# Patient Record
Sex: Female | Born: 1950 | Race: Black or African American | Hispanic: No | Marital: Married | Smoking: Never smoker
Health system: Southern US, Community
[De-identification: ages and names within clinical notes are randomized; demographics above are authoritative.]

## PROBLEM LIST (undated history)

## (undated) DIAGNOSIS — E039 Hypothyroidism, unspecified: Secondary | ICD-10-CM

## (undated) DIAGNOSIS — E7800 Pure hypercholesterolemia, unspecified: Secondary | ICD-10-CM

## (undated) DIAGNOSIS — J45909 Unspecified asthma, uncomplicated: Secondary | ICD-10-CM

## (undated) DIAGNOSIS — K5792 Diverticulitis of intestine, part unspecified, without perforation or abscess without bleeding: Secondary | ICD-10-CM

## (undated) DIAGNOSIS — Z853 Personal history of malignant neoplasm of breast: Secondary | ICD-10-CM

## (undated) DIAGNOSIS — J309 Allergic rhinitis, unspecified: Secondary | ICD-10-CM

## (undated) DIAGNOSIS — E119 Type 2 diabetes mellitus without complications: Secondary | ICD-10-CM

## (undated) DIAGNOSIS — Z860100 Personal history of colon polyps, unspecified: Secondary | ICD-10-CM

## (undated) DIAGNOSIS — N952 Postmenopausal atrophic vaginitis: Secondary | ICD-10-CM

## (undated) DIAGNOSIS — F430 Acute stress reaction: Secondary | ICD-10-CM

## (undated) DIAGNOSIS — E559 Vitamin D deficiency, unspecified: Secondary | ICD-10-CM

## (undated) DIAGNOSIS — E041 Nontoxic single thyroid nodule: Secondary | ICD-10-CM

## (undated) DIAGNOSIS — M545 Low back pain, unspecified: Secondary | ICD-10-CM

## (undated) DIAGNOSIS — D869 Sarcoidosis, unspecified: Secondary | ICD-10-CM

## (undated) DIAGNOSIS — K219 Gastro-esophageal reflux disease without esophagitis: Secondary | ICD-10-CM

## (undated) DIAGNOSIS — F419 Anxiety disorder, unspecified: Secondary | ICD-10-CM

## (undated) DIAGNOSIS — G479 Sleep disorder, unspecified: Secondary | ICD-10-CM

## (undated) DIAGNOSIS — I10 Essential (primary) hypertension: Secondary | ICD-10-CM

## (undated) DIAGNOSIS — M109 Gout, unspecified: Secondary | ICD-10-CM

## (undated) DIAGNOSIS — N189 Chronic kidney disease, unspecified: Secondary | ICD-10-CM

## (undated) HISTORY — DX: Personal history of colon polyps, unspecified: Z86.0100

## (undated) HISTORY — DX: Vitamin D deficiency, unspecified: E55.9

## (undated) HISTORY — PX: MASTECTOMY: SHX3

## (undated) HISTORY — PX: TONSILLECTOMY: SUR1361

## (undated) HISTORY — DX: Personal history of colonic polyps: Z86.010

## (undated) HISTORY — DX: Allergic rhinitis, unspecified: J30.9

## (undated) HISTORY — DX: Type 2 diabetes mellitus without complications: E11.9

## (undated) HISTORY — DX: Sleep disorder, unspecified: G47.9

## (undated) HISTORY — PX: TOTAL ABDOMINAL HYSTERECTOMY: SHX209

## (undated) HISTORY — PX: CHOLECYSTECTOMY: SHX55

## (undated) HISTORY — PX: BREAST LUMPECTOMY: SHX2

## (undated) HISTORY — DX: Gout, unspecified: M10.9

## (undated) HISTORY — DX: Hypothyroidism, unspecified: E03.9

## (undated) HISTORY — PX: LAPAROSCOPIC INGUINAL HERNIA WITH UMBILICAL HERNIA: SHX5658

## (undated) HISTORY — DX: Personal history of malignant neoplasm of breast: Z85.3

## (undated) HISTORY — DX: Anxiety disorder, unspecified: F41.9

## (undated) HISTORY — PX: HEMORRHOID SURGERY: SHX153

## (undated) HISTORY — DX: Pure hypercholesterolemia, unspecified: E78.00

## (undated) HISTORY — PX: COLONOSCOPY: SHX174

## (undated) HISTORY — DX: Essential (primary) hypertension: I10

## (undated) HISTORY — DX: Gastro-esophageal reflux disease without esophagitis: K21.9

## (undated) HISTORY — DX: Unspecified asthma, uncomplicated: J45.909

## (undated) HISTORY — DX: Postmenopausal atrophic vaginitis: N95.2

## (undated) HISTORY — DX: Sarcoidosis, unspecified: D86.9

## (undated) HISTORY — DX: Low back pain, unspecified: M54.50

## (undated) HISTORY — DX: Diverticulitis of intestine, part unspecified, without perforation or abscess without bleeding: K57.92

## (undated) HISTORY — PX: OTHER SURGICAL HISTORY: SHX169

## (undated) HISTORY — DX: Low back pain: M54.5

## (undated) HISTORY — DX: Nontoxic single thyroid nodule: E04.1

## (undated) HISTORY — PX: TUBAL LIGATION: SHX77

## (undated) HISTORY — DX: Acute stress reaction: F43.0

---

## 2000-05-01 ENCOUNTER — Inpatient Hospital Stay (HOSPITAL_COMMUNITY): Admission: AD | Admit: 2000-05-01 | Discharge: 2000-05-02 | Payer: Self-pay | Admitting: Obstetrics

## 2000-05-02 ENCOUNTER — Encounter: Payer: Self-pay | Admitting: *Deleted

## 2000-08-08 ENCOUNTER — Other Ambulatory Visit: Admission: RE | Admit: 2000-08-08 | Discharge: 2000-08-08 | Payer: Self-pay | Admitting: Obstetrics and Gynecology

## 2000-08-08 ENCOUNTER — Encounter: Payer: Self-pay | Admitting: Obstetrics and Gynecology

## 2000-08-08 ENCOUNTER — Encounter: Admission: RE | Admit: 2000-08-08 | Discharge: 2000-08-08 | Payer: Self-pay | Admitting: Obstetrics and Gynecology

## 2000-08-08 ENCOUNTER — Encounter (INDEPENDENT_AMBULATORY_CARE_PROVIDER_SITE_OTHER): Payer: Self-pay | Admitting: *Deleted

## 2002-07-21 ENCOUNTER — Other Ambulatory Visit: Admission: RE | Admit: 2002-07-21 | Discharge: 2002-07-21 | Payer: Self-pay | Admitting: Obstetrics and Gynecology

## 2002-07-26 ENCOUNTER — Encounter: Payer: Self-pay | Admitting: Obstetrics and Gynecology

## 2002-07-26 ENCOUNTER — Ambulatory Visit (HOSPITAL_COMMUNITY): Admission: RE | Admit: 2002-07-26 | Discharge: 2002-07-26 | Payer: Self-pay | Admitting: Obstetrics and Gynecology

## 2002-07-27 ENCOUNTER — Encounter: Payer: Self-pay | Admitting: Obstetrics and Gynecology

## 2002-07-27 ENCOUNTER — Encounter: Admission: RE | Admit: 2002-07-27 | Discharge: 2002-07-27 | Payer: Self-pay | Admitting: Obstetrics and Gynecology

## 2002-11-03 ENCOUNTER — Ambulatory Visit (HOSPITAL_COMMUNITY): Admission: RE | Admit: 2002-11-03 | Discharge: 2002-11-03 | Payer: Self-pay | Admitting: Obstetrics and Gynecology

## 2002-11-03 ENCOUNTER — Encounter: Payer: Self-pay | Admitting: Obstetrics and Gynecology

## 2002-11-09 ENCOUNTER — Encounter: Admission: RE | Admit: 2002-11-09 | Discharge: 2002-11-22 | Payer: Self-pay | Admitting: Family Medicine

## 2003-08-23 ENCOUNTER — Encounter: Admission: RE | Admit: 2003-08-23 | Discharge: 2003-08-23 | Payer: Self-pay | Admitting: Obstetrics and Gynecology

## 2003-09-09 ENCOUNTER — Ambulatory Visit (HOSPITAL_COMMUNITY): Admission: RE | Admit: 2003-09-09 | Discharge: 2003-09-09 | Payer: Self-pay | Admitting: Obstetrics and Gynecology

## 2003-09-16 ENCOUNTER — Other Ambulatory Visit: Admission: RE | Admit: 2003-09-16 | Discharge: 2003-09-16 | Payer: Self-pay | Admitting: Obstetrics and Gynecology

## 2004-09-26 ENCOUNTER — Other Ambulatory Visit: Admission: RE | Admit: 2004-09-26 | Discharge: 2004-09-26 | Payer: Self-pay | Admitting: Obstetrics and Gynecology

## 2004-10-09 ENCOUNTER — Ambulatory Visit (HOSPITAL_COMMUNITY): Admission: RE | Admit: 2004-10-09 | Discharge: 2004-10-09 | Payer: Self-pay | Admitting: Obstetrics and Gynecology

## 2004-10-09 ENCOUNTER — Encounter: Admission: RE | Admit: 2004-10-09 | Discharge: 2004-10-09 | Payer: Self-pay | Admitting: Obstetrics and Gynecology

## 2005-04-16 ENCOUNTER — Ambulatory Visit: Payer: Self-pay | Admitting: Internal Medicine

## 2005-04-23 ENCOUNTER — Ambulatory Visit: Payer: Self-pay | Admitting: *Deleted

## 2005-09-19 ENCOUNTER — Ambulatory Visit (HOSPITAL_COMMUNITY): Admission: RE | Admit: 2005-09-19 | Discharge: 2005-09-19 | Payer: Self-pay | Admitting: Obstetrics and Gynecology

## 2005-09-30 ENCOUNTER — Other Ambulatory Visit: Admission: RE | Admit: 2005-09-30 | Discharge: 2005-09-30 | Payer: Self-pay | Admitting: Obstetrics and Gynecology

## 2005-10-18 ENCOUNTER — Encounter: Admission: RE | Admit: 2005-10-18 | Discharge: 2005-10-18 | Payer: Self-pay | Admitting: Obstetrics and Gynecology

## 2005-10-25 ENCOUNTER — Encounter: Admission: RE | Admit: 2005-10-25 | Discharge: 2005-10-25 | Payer: Self-pay | Admitting: Obstetrics and Gynecology

## 2005-11-07 ENCOUNTER — Encounter: Admission: RE | Admit: 2005-11-07 | Discharge: 2005-11-07 | Payer: Self-pay | Admitting: General Surgery

## 2005-11-13 ENCOUNTER — Encounter: Admission: RE | Admit: 2005-11-13 | Discharge: 2005-11-13 | Payer: Self-pay | Admitting: Obstetrics and Gynecology

## 2005-11-19 ENCOUNTER — Encounter: Admission: RE | Admit: 2005-11-19 | Discharge: 2005-11-19 | Payer: Self-pay | Admitting: Obstetrics and Gynecology

## 2005-11-19 ENCOUNTER — Encounter (INDEPENDENT_AMBULATORY_CARE_PROVIDER_SITE_OTHER): Payer: Self-pay | Admitting: Specialist

## 2006-02-24 ENCOUNTER — Encounter: Admission: RE | Admit: 2006-02-24 | Discharge: 2006-02-24 | Payer: Self-pay | Admitting: Endocrinology

## 2006-03-06 ENCOUNTER — Ambulatory Visit (HOSPITAL_COMMUNITY): Admission: RE | Admit: 2006-03-06 | Discharge: 2006-03-06 | Payer: Self-pay | Admitting: Endocrinology

## 2006-03-06 ENCOUNTER — Encounter (INDEPENDENT_AMBULATORY_CARE_PROVIDER_SITE_OTHER): Payer: Self-pay | Admitting: Specialist

## 2006-03-27 ENCOUNTER — Ambulatory Visit: Payer: Self-pay | Admitting: Internal Medicine

## 2006-10-02 ENCOUNTER — Other Ambulatory Visit: Admission: RE | Admit: 2006-10-02 | Discharge: 2006-10-02 | Payer: Self-pay | Admitting: Obstetrics and Gynecology

## 2006-10-27 ENCOUNTER — Encounter: Admission: RE | Admit: 2006-10-27 | Discharge: 2006-10-27 | Payer: Self-pay | Admitting: Obstetrics and Gynecology

## 2006-11-06 ENCOUNTER — Ambulatory Visit: Payer: Self-pay | Admitting: Internal Medicine

## 2006-12-23 ENCOUNTER — Ambulatory Visit (HOSPITAL_COMMUNITY): Admission: RE | Admit: 2006-12-23 | Discharge: 2006-12-23 | Payer: Self-pay | Admitting: Obstetrics and Gynecology

## 2007-01-09 ENCOUNTER — Emergency Department (HOSPITAL_COMMUNITY): Admission: EM | Admit: 2007-01-09 | Discharge: 2007-01-09 | Payer: Self-pay | Admitting: Emergency Medicine

## 2007-01-29 ENCOUNTER — Encounter: Admission: RE | Admit: 2007-01-29 | Discharge: 2007-01-29 | Payer: Self-pay | Admitting: Endocrinology

## 2007-04-28 ENCOUNTER — Encounter: Admission: RE | Admit: 2007-04-28 | Discharge: 2007-04-28 | Payer: Self-pay | Admitting: Obstetrics and Gynecology

## 2007-09-18 ENCOUNTER — Encounter: Admission: RE | Admit: 2007-09-18 | Discharge: 2007-09-18 | Payer: Self-pay | Admitting: Endocrinology

## 2007-10-05 ENCOUNTER — Other Ambulatory Visit: Admission: RE | Admit: 2007-10-05 | Discharge: 2007-10-05 | Payer: Self-pay | Admitting: Obstetrics and Gynecology

## 2007-10-28 ENCOUNTER — Encounter: Admission: RE | Admit: 2007-10-28 | Discharge: 2007-10-28 | Payer: Self-pay | Admitting: Obstetrics and Gynecology

## 2008-02-12 ENCOUNTER — Encounter: Admission: RE | Admit: 2008-02-12 | Discharge: 2008-02-12 | Payer: Self-pay | Admitting: Endocrinology

## 2008-07-29 ENCOUNTER — Encounter: Admission: RE | Admit: 2008-07-29 | Discharge: 2008-07-29 | Payer: Self-pay | Admitting: Endocrinology

## 2008-09-06 ENCOUNTER — Ambulatory Visit: Payer: Self-pay | Admitting: Internal Medicine

## 2008-09-06 ENCOUNTER — Telehealth (INDEPENDENT_AMBULATORY_CARE_PROVIDER_SITE_OTHER): Payer: Self-pay | Admitting: *Deleted

## 2008-09-06 DIAGNOSIS — J45901 Unspecified asthma with (acute) exacerbation: Secondary | ICD-10-CM | POA: Insufficient documentation

## 2008-09-06 DIAGNOSIS — K219 Gastro-esophageal reflux disease without esophagitis: Secondary | ICD-10-CM | POA: Insufficient documentation

## 2008-09-06 DIAGNOSIS — J3089 Other allergic rhinitis: Secondary | ICD-10-CM

## 2008-09-06 DIAGNOSIS — D869 Sarcoidosis, unspecified: Secondary | ICD-10-CM | POA: Insufficient documentation

## 2008-09-06 DIAGNOSIS — J302 Other seasonal allergic rhinitis: Secondary | ICD-10-CM | POA: Insufficient documentation

## 2008-10-05 ENCOUNTER — Other Ambulatory Visit: Admission: RE | Admit: 2008-10-05 | Discharge: 2008-10-05 | Payer: Self-pay | Admitting: Obstetrics and Gynecology

## 2008-10-28 ENCOUNTER — Encounter: Admission: RE | Admit: 2008-10-28 | Discharge: 2008-10-28 | Payer: Self-pay | Admitting: Obstetrics and Gynecology

## 2009-02-07 ENCOUNTER — Ambulatory Visit (HOSPITAL_BASED_OUTPATIENT_CLINIC_OR_DEPARTMENT_OTHER): Admission: RE | Admit: 2009-02-07 | Discharge: 2009-02-07 | Payer: Self-pay | Admitting: Orthopedic Surgery

## 2009-04-06 ENCOUNTER — Telehealth: Payer: Self-pay | Admitting: Internal Medicine

## 2009-10-10 ENCOUNTER — Other Ambulatory Visit: Admission: RE | Admit: 2009-10-10 | Discharge: 2009-10-10 | Payer: Self-pay | Admitting: Obstetrics and Gynecology

## 2009-10-30 ENCOUNTER — Encounter: Admission: RE | Admit: 2009-10-30 | Discharge: 2009-10-30 | Payer: Self-pay | Admitting: Obstetrics and Gynecology

## 2010-01-31 ENCOUNTER — Encounter: Admission: RE | Admit: 2010-01-31 | Discharge: 2010-01-31 | Payer: Self-pay | Admitting: Endocrinology

## 2010-03-05 ENCOUNTER — Observation Stay (HOSPITAL_COMMUNITY): Admission: EM | Admit: 2010-03-05 | Discharge: 2010-03-06 | Payer: Self-pay | Admitting: Emergency Medicine

## 2010-03-05 ENCOUNTER — Encounter (INDEPENDENT_AMBULATORY_CARE_PROVIDER_SITE_OTHER): Payer: Self-pay | Admitting: Interventional Cardiology

## 2010-06-15 ENCOUNTER — Other Ambulatory Visit: Payer: Self-pay | Admitting: Family Medicine

## 2010-06-17 ENCOUNTER — Encounter: Payer: Self-pay | Admitting: Orthopedic Surgery

## 2010-06-17 ENCOUNTER — Encounter: Payer: Self-pay | Admitting: Obstetrics and Gynecology

## 2010-06-18 ENCOUNTER — Encounter: Payer: Self-pay | Admitting: Endocrinology

## 2010-08-09 LAB — CBC
Hemoglobin: 13.9 g/dL (ref 12.0–15.0)
Hemoglobin: 14.7 g/dL (ref 12.0–15.0)
MCH: 31.1 pg (ref 26.0–34.0)
MCHC: 34.6 g/dL (ref 30.0–36.0)
MCHC: 35 g/dL (ref 30.0–36.0)
Platelets: 199 10*3/uL (ref 150–400)
RBC: 4.73 MIL/uL (ref 3.87–5.11)
RDW: 12.4 % (ref 11.5–15.5)
WBC: 4.3 10*3/uL (ref 4.0–10.5)

## 2010-08-09 LAB — CARDIAC PANEL(CRET KIN+CKTOT+MB+TROPI)
CK, MB: 2.8 ng/mL (ref 0.3–4.0)
Relative Index: 1.4 (ref 0.0–2.5)
Total CK: 200 U/L — ABNORMAL HIGH (ref 7–177)
Total CK: 202 U/L — ABNORMAL HIGH (ref 7–177)
Troponin I: 0.01 ng/mL (ref 0.00–0.06)

## 2010-08-09 LAB — COMPREHENSIVE METABOLIC PANEL
ALT: 30 U/L (ref 0–35)
AST: 25 U/L (ref 0–37)
Albumin: 3.9 g/dL (ref 3.5–5.2)
Alkaline Phosphatase: 53 U/L (ref 39–117)
Calcium: 9.3 mg/dL (ref 8.4–10.5)
GFR calc Af Amer: >60 mL/min (ref >60)
Potassium: 4 mEq/L (ref 3.5–5.1)
Sodium: 140 mEq/L (ref 135–145)
Total Protein: 6.6 g/dL (ref 6.0–8.3)

## 2010-08-09 LAB — LIPID PANEL
Cholesterol: 224 mg/dL — ABNORMAL HIGH (ref 0–200)
LDL Cholesterol: 154 mg/dL — ABNORMAL HIGH (ref 0–99)

## 2010-08-09 LAB — HEMOGLOBIN A1C: Hgb A1c MFr Bld: 6.3 % — ABNORMAL HIGH (ref <5.7)

## 2010-08-09 LAB — PROTIME-INR

## 2010-08-09 LAB — DIFFERENTIAL
Eosinophils Absolute: 0.1 10*3/uL (ref 0.0–0.7)
Eosinophils Relative: 2 % (ref 0–5)
Lymphs Abs: 1.9 10*3/uL (ref 0.7–4.0)
Monocytes Absolute: 0.4 10*3/uL (ref 0.1–1.0)
Monocytes Relative: 8 % (ref 3–12)

## 2010-08-09 LAB — APTT: aPTT: 28 seconds (ref 24–37)

## 2010-08-09 LAB — POCT CARDIAC MARKERS: Myoglobin, poc: 65.9 ng/mL (ref 12–200)

## 2010-08-09 LAB — CK TOTAL AND CKMB (NOT AT ARMC): Total CK: 205 U/L — ABNORMAL HIGH (ref 7–177)

## 2010-08-09 LAB — D-DIMER, QUANTITATIVE

## 2010-08-31 LAB — POCT I-STAT, CHEM 8
Calcium, Ion: 1.24 mmol/L (ref 1.12–1.32)
Chloride: 103 mEq/L (ref 96–112)
Creatinine, Ser: 1 mg/dL (ref 0.4–1.2)
Glucose, Bld: 127 mg/dL — ABNORMAL HIGH (ref 70–99)
Potassium: 3.5 mEq/L (ref 3.5–5.1)

## 2010-10-08 ENCOUNTER — Other Ambulatory Visit: Payer: Self-pay | Admitting: Obstetrics and Gynecology

## 2010-10-08 DIAGNOSIS — Z1231 Encounter for screening mammogram for malignant neoplasm of breast: Secondary | ICD-10-CM

## 2010-10-09 NOTE — Assessment & Plan Note (Signed)
[facility]                             PULMONARY OFFICE NOTE   Hannah Nicholson, Hannah Nicholson                    MRN:          [id number]  DATE:11/06/2006                            DOB:          Jun 19, 1950    PROBLEM:  1. History of sarcoid.  2. Asthma.  3. Allergic rhinitis.  4. Esophageal reflux.   HISTORY:  She describes sudden onset Friday night by lying in bed of a  lot of cough.  She had upper endoscopy 1 day prior.  Cough has been  getting worse.  She was outdoors a lot yesterday, and now has had  bilateral sense of pressure congestion in the maxillary sinus areas.  There has been sore throat, some tussive soreness at the midsternal  level.  No dysphagia.  Cough has gradually become productive of clear  mucus, and when she coughs, she definitely notices regurgitation.  There  has been nothing purulent.   MEDICATIONS:  1. Acetazolamide 250 mg b.i.d.  2. Hydrochlorothiazide 25 mg.  3. Zegerid 40 mg.  4. She has had Advair and albuterol, but is not using them regularly.   She has not had adenopathy, night sweats, rash, or joint pain.   DRUG INTOLERANT TO CONTRAST DYE, WHICH CAUSED SYNCOPE ONCE.   OBJECTIVE:  Weight 187 pounds.  BP 108/68.  Pulse 86.  Room air  saturation 98%.  Dry cough.  Pharynx is not easily visualized, but is not obviously red.  I do not see postnasal drainage.  There is little nasal congestion.  Voice quality normal.  No stridor.  I do not find adenopathy at the neck, shoulders, or axillae, and I do  not find rash or obvious arthritis.  HEART:  Sounds are regular without murmur, gallop, or rub.  Other than the cough, I do not hear rales, rhonchi, or wheeze.   IMPRESSION:  Abrupt onset while sleeping in this context would be  consistent with reflux, and perhaps aspiration.  The sinus complaints  might be reflex, but more likely are secondary to air quality problems,  and the time she spent outside the other day.  Onset  seems very abrupt  for either a viral illness or exacerbation of her sarcoid.   PLAN:  1. Neo-Synephrine inhalation.  2. Depo-Medrol 80 mg IM.  3. Phenergan with codeine cough syrup 200 mL to take 5 mL q.6h. not      p.r.n.  4. Fluids.  5. Guaifenesin.  6. Z-Pak.  7. Elevate head of bed on brick.  8. Reflux precautions were discussed.  9. She has a pending return appointment, and will keep that.  10.Watch need for chest x-ray, ACE level, CBC.  11.I considered, but do not think her symptoms suggest esophageal      perforation.     Clinton D. Maple Hudson, MD, Tonny Bollman, FACP  Electronically Signed    CDY/MedQ  DD: 11/06/2006  DT: 11/07/2006  Job #: [id number]   cc:   C. Duane Lope, M.D.  Graylin Shiver, M.D.

## 2010-10-12 NOTE — Discharge Summary (Signed)
[facility]  Patient:    Hannah Nicholson, Hannah Nicholson                      MRN: [id number] Adm. Date:  [id number] Disc. Date: [id number] Attending:  Deniece Ree                           Discharge Summary  SUMMARY:                      Patient was evaluated initially by Dr. Francoise Ceo and indicated she was a 60 year old gravida 4, para 4 who had had a hysterectomy four years prior to this evaluation.  She developed some right lower quadrant pain Friday and was evaluated at urgent care for a possible appendicitis.  The ultrasound at that time indicated that the patient had an ovarian cyst and was given Tylox.  The patients pain began getting worse that night and therefore came to triage for evaluation.  MEDICATIONS:                  Patient was taking a diuretic and an antihypertensive medication.  PAST MEDICAL HISTORY:         Sarcoid.  PAST SURGICAL HISTORY:        Hysterectomy and gallbladder surgery in the past.  PHYSICAL EXAMINATION  VITAL SIGNS:                  Temperature 101.  The patient was admitted for evaluation.  LABORATORIES:                 All within normal limits.                                Barium enema was utilized as well as an ultrasound and a diagnosis showing diverticula was identified.                                Further discussion with this patient at this time indicated that she had had diverticulitis, however, had not had a problem for the past year and therefore did not mention it.  After this was concluded the patient was then discharged.  She was to see her internal medicine doctor who had been following her diverticulitis in the past.  She was told to return in four weeks for followup evaluation with Dr. Gaynell Face or to let us know should any problems arise in that time. DD:  07/17/00 TD:  07/17/00 Job: [id number]

## 2010-10-12 NOTE — Assessment & Plan Note (Signed)
[facility]                               PULMONARY OFFICE NOTE   Hannah Nicholson, Hannah Nicholson                    MRN:          [id number]  DATE:03/27/2006                            DOB:          1950-11-25    PROBLEMS:  1. History of sarcoid.  2. Asthma.  3. Allergic rhinitis.   HISTORY:  One year followup. Had sinus surgery with Dr. Lazarus Salines to reduce  her turbinates. Did well until last month and especially in the past two  weeks she has had increased nasal drainage, itching of eyes, some sneezing,  mild chest tightness and mild wheeze. She never did get sore throat with  this or fever. She is pending thyroid nodule surgery.   MEDICATIONS:  1. Acetazolamide 250 mg b.i.d.  2. Hydrochlorothiazide 25 mg.  3. Prilosec OTC.  4. Zoloft 1/2 x50 mg.  5. Allegra 60 mg b.i.d.  6. Sudafed.   OBJECTIVE:  Weight 188 pounds, blood pressure 112/66, pulse regular 65, room  air saturation 98%. Conjunctivae are clear. Nasal mucosa is pale, boggy and  wet. Lungs are clear, I do not see post-nasal drip. Heart sounds are regular  and normal.   IMPRESSION:  Allergic rhinitis with exacerbation. Allergic conjunctivitis.  Mild intermittent asthma.   PLAN:  1. Neo-Synephrine inhalation with Depo-Medrol 80 mg IM and steroid talk.  2. Samples Xyzal 5 mg daily p.r.n. antihistamine for trial.  3. Schedule return one year, but earlier p.r.n.     Clinton D. Maple Hudson, MD, Tonny Bollman, FACP  Electronically Signed    CDY/MedQ  DD: 03/29/2006  DT: 03/30/2006  Job #: [id number]   cc:   C. Duane Lope, M.D.

## 2010-11-01 ENCOUNTER — Ambulatory Visit
Admission: RE | Admit: 2010-11-01 | Discharge: 2010-11-01 | Disposition: A | Discharge status: Home / Self Care | Payer: Commercial Indemnity | Source: Ambulatory Visit | Attending: Obstetrics and Gynecology | Admitting: Obstetrics and Gynecology

## 2010-11-01 DIAGNOSIS — Z1231 Encounter for screening mammogram for malignant neoplasm of breast: Secondary | ICD-10-CM

## 2010-11-09 ENCOUNTER — Other Ambulatory Visit (HOSPITAL_COMMUNITY)
Admission: RE | Admit: 2010-11-09 | Discharge: 2010-11-09 | Disposition: A | Discharge status: Home / Self Care | Payer: Commercial Indemnity | Source: Ambulatory Visit | Attending: Obstetrics and Gynecology | Admitting: Obstetrics and Gynecology

## 2010-11-09 ENCOUNTER — Other Ambulatory Visit: Payer: Self-pay | Admitting: Obstetrics and Gynecology

## 2010-11-09 DIAGNOSIS — Z01419 Encounter for gynecological examination (general) (routine) without abnormal findings: Secondary | ICD-10-CM | POA: Insufficient documentation

## 2010-11-16 ENCOUNTER — Encounter: Payer: Self-pay | Admitting: Internal Medicine

## 2010-11-19 ENCOUNTER — Ambulatory Visit (INDEPENDENT_AMBULATORY_CARE_PROVIDER_SITE_OTHER): Payer: Commercial Indemnity | Admitting: Internal Medicine

## 2010-11-19 ENCOUNTER — Encounter: Payer: Self-pay | Admitting: Internal Medicine

## 2010-11-19 ENCOUNTER — Other Ambulatory Visit (INDEPENDENT_AMBULATORY_CARE_PROVIDER_SITE_OTHER): Payer: Commercial Indemnity

## 2010-11-19 VITALS — BP 122/76 | HR 81 | Ht 63.0 in | Wt 196.0 lb

## 2010-11-19 DIAGNOSIS — D869 Sarcoidosis, unspecified: Secondary | ICD-10-CM

## 2010-11-19 DIAGNOSIS — H109 Unspecified conjunctivitis: Secondary | ICD-10-CM | POA: Insufficient documentation

## 2010-11-19 LAB — CBC WITH DIFFERENTIAL/PLATELET
Basophils Relative: 0.8 % (ref 0.0–3.0)
Eosinophils Absolute: 0.1 10*3/uL (ref 0.0–0.7)
HCT: 41 % (ref 36.0–46.0)
Hemoglobin: 14.2 g/dL (ref 12.0–15.0)
Lymphs Abs: 1.1 10*3/uL (ref 0.7–4.0)
MCHC: 34.7 g/dL (ref 30.0–36.0)
MCV: 89.8 fl (ref 78.0–100.0)
Monocytes Absolute: 0.2 10*3/uL (ref 0.1–1.0)
Neutro Abs: 1.9 10*3/uL (ref 1.4–7.7)
RBC: 4.56 Mil/uL (ref 3.87–5.11)
RDW: 12.5 % (ref 11.5–14.6)

## 2010-11-19 NOTE — Assessment & Plan Note (Addendum)
An allergy profile may halep determine if this is just overdrying from dry air in the work place We discussed possibility of placing an air cleaner or humidifier near her workplace.

## 2010-11-19 NOTE — Assessment & Plan Note (Signed)
I doubt her eye discomfort is from sarcoid, but will recheck her ACE level

## 2010-11-19 NOTE — Patient Instructions (Addendum)
Lab- ACE level- dx Sarcoid         CBC w/ diff, sed rate, ANA,  Allergy Profile- dx conjunctivitis  Try otc lacrilube ointment - put 1/8th inch inside lower lid at bedtime.

## 2010-11-19 NOTE — Progress Notes (Signed)
  Subjective:    Patient ID: Hannah Nicholson, female    DOB: 07-20-1950, 60 y.o.   MRN: [id number]  HPI 11/19/10- 88 yoF never smoker  followed for sarcoid, allergic rhinitis, asthma, complicated by GERD Last here 09/05/08-Note reviewed Complains of  burning, watering eyes over the past year. Has seen two eye doctors and has tried Fiserv, Sustain, and is also using Flonase for this, with little nasal discomfort. Eyes are worse at work. 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 Uses allegra and another antihistamine.  Admits a little post nasal drip. Denies nodes, rash, night sweats. Seems worse at work.    Review of Systems Constitutional:   No weight loss, night sweats,  Fevers, chills, fatigue, lassitude. HEENT:   No headaches,  Difficulty swallowing,  Tooth/dental problems,  Sore throat,                No sneezing, itching, ear ache, nasal congestion, post nasal drip,   CV:  No chest pain,  Orthopnea, PND, swelling in lower extremities, anasarca, dizziness, palpitations  GI  No heartburn, indigestion, abdominal pain, nausea, vomiting, diarrhea, change in bowel habits, loss of appetite  Resp: No shortness of breath with exertion or at rest.  No excess mucus, no productive cough,  No non-productive cough,  No coughing up of blood.  No change in color of mucus.  No wheezing.  Skin: no rash or lesions.  GU: no dysuria, change in color of urine, no urgency or frequency.  No flank pain.  MS:  No joint pain or swelling.  No decreased range of motion.  No back pain.  Psych:  No change in mood or affect. No depression or anxiety.  No memory loss.     Objective:   Physical Exam General- Alert, Oriented, Affect-appropriate, Distress- none acute  Skin- rash-none, lesions- none, excoriation- none  Lymphadenopathy- none  Head- atraumatic  Eyes- Gross vision intact, PERRLA, conjunctivae clear secretions- not obviously injected  Ears-  Hearing, canals, Tm - normal  Nose- Clear, No- Septal dev, mucus, polyps, erosion, perforation   Throat- Mallampati IV , mucosa clear , drainage- none, tonsils- atrophic  Neck- flexible , trachea midline, no stridor , thyroid nl, carotid no bruit  Chest - symmetrical excursion , unlabored     Heart/CV- RRR , no murmur , no gallop  , no rub, nl s1 s2                     - JVD- none , edema- none, stasis changes- none, varices- none     Lung- clear to P&A, wheeze- none, cough- none , dullness-none, rub- none     Chest wall-  Abd- tender-no, distended-no, bowel sounds-present, HSM- no  Br/ Gen/ Rectal- Not done, not indicated  Extrem- cyanosis- none, clubbing, none, atrophy- none, strength- nl  Neuro- grossly intact to observation         Assessment & Plan:

## 2010-11-20 LAB — ALLERGY PROFILE REGION II-DC, DE, MD, ~~LOC~~, VA
Allergen, D pternoyssinus,d7: <0.1 kU/L (ref <0.35)
Bermuda Grass: <0.1 kU/L (ref <0.35)
Box Elder IgE: <0.1 kU/L (ref <0.35)
Cockroach: <0.1 kU/L (ref <0.35)
Common Ragweed: <0.1 kU/L (ref <0.35)
Dog Dander: <0.1 kU/L (ref <0.35)
Johnson Grass: <0.1 kU/L (ref <0.35)
Lamb's Quarters: <0.1 kU/L (ref <0.35)
Meadow Grass: <0.1 kU/L (ref <0.35)
Pecan/Hickory Tree IgE: <0.1 kU/L (ref <0.35)

## 2010-11-23 ENCOUNTER — Encounter: Payer: Self-pay | Admitting: Internal Medicine

## 2010-11-29 NOTE — Progress Notes (Signed)
Quick Note:  Pt aware of results. ______ 

## 2010-11-29 NOTE — Progress Notes (Signed)
Quick Note:  LMTCB ______ 

## 2010-12-25 ENCOUNTER — Ambulatory Visit: Payer: Commercial Indemnity | Admitting: Internal Medicine

## 2011-01-18 ENCOUNTER — Other Ambulatory Visit: Payer: Commercial Indemnity

## 2011-01-18 ENCOUNTER — Encounter: Payer: Self-pay | Admitting: Internal Medicine

## 2011-01-18 ENCOUNTER — Ambulatory Visit (INDEPENDENT_AMBULATORY_CARE_PROVIDER_SITE_OTHER): Payer: Commercial Indemnity | Admitting: Internal Medicine

## 2011-01-18 VITALS — BP 124/76 | HR 68 | Ht 63.0 in | Wt 195.2 lb

## 2011-01-18 DIAGNOSIS — D869 Sarcoidosis, unspecified: Secondary | ICD-10-CM

## 2011-01-18 DIAGNOSIS — J45909 Unspecified asthma, uncomplicated: Secondary | ICD-10-CM

## 2011-01-18 DIAGNOSIS — K219 Gastro-esophageal reflux disease without esophagitis: Secondary | ICD-10-CM

## 2011-01-18 MED ORDER — METHYLPREDNISOLONE ACETATE 80 MG/ML IJ SUSP
80.0000 mg | Freq: Once | INTRAMUSCULAR | Status: AC
  Administered 2011-01-18: 80 mg via INTRAMUSCULAR

## 2011-01-18 MED ORDER — ALBUTEROL SULFATE (2.5 MG/3ML) 0.083% IN NEBU
2.5000 mg | INHALATION_SOLUTION | Freq: Once | RESPIRATORY_TRACT | Status: AC
  Administered 2011-01-18: 2.5 mg via RESPIRATORY_TRACT

## 2011-01-18 NOTE — Progress Notes (Signed)
Subjective:    Patient ID: Hannah Nicholson, female    DOB: 1951/05/06, 60 y.o.   MRN: [id number]  HPI 11/19/10- 78 yoF never smoker  followed for sarcoid, allergic rhinitis, asthma, complicated by GERD Last here 09/05/08-Note reviewed Complains of  burning, watering eyes over the past year. Has seen two eye doctors and has tried Fiserv, Sustain, and is also using Flonase for this, with little nasal discomfort. Eyes are worse at work. 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 Uses allegra and another antihistamine.  Admits a little post nasal drip. Denies nodes, rash, night sweats. Seems worse at work.   8/224/12- 60 yoF never smoker  followed for sarcoid, allergic rhinitis, asthma, complicated by GERD Eyes stopped burning when she stopped antihistamine which may have been overdrying, and then went out of town.  Felt more comfortable while in [place], [place], compared with here. Came back 6 weeks ago and was comfortable at first. Now for 2 weeks has felt smothered with some cough, no wheeze, just winded. No pain. Maybe some palpitation w/o swelling, pain or aching in her legs. Now has postnasal drip. Not a cold. She took Equate allergy tablet- helped breathing a little. Proventil HFA has helped a little. Reflux is not obvious to her now and she takes Zegerid sometimes.  Office spirometry- normal spirometry FEV1 2.18/106% FEV1/FVC 0.73 FEF 25-75% 1.57/63% Review of Systems  Constitutional:   No weight loss, night sweats,  Fevers, chills, fatigue, lassitude. HEENT:   No headaches,  Difficulty swallowing,  Tooth/dental problems,  Sore throat,                No sneezing, itching, ear ache, nasal congestion, post nasal drip,  CV:  No chest pain,  Orthopnea, PND, swelling in lower extremities, anasarca, dizziness, ? palpitations GI  No heartburn, indigestion, abdominal pain, nausea, vomiting, diarrhea, change in bowel habits, loss of appetite Resp: +  shortness of breath with exertion not at rest.  No excess mucus, no productive cough,  little non-productive cough,  No coughing up of blood.  No change in color of mucus.  No wheezing.  Skin: no rash or lesions. GU: no dysuria, change in color of urine, no urgency or frequency.  No flank pain. MS:  No joint pain or swelling.  No decreased range of motion.  No back pain. Psych:  No change in mood or affect. No depression or anxiety.  No memory loss.     Objective:   Physical Exam  General- Alert, Oriented, Affect-appropriate, Distress- none acute  Overweight, calm Skin- rash-none, lesions- none, excoriation- none Lymphadenopathy- none Head- atraumatic            Eyes- Gross vision intact, PERRLA, conjunctivae clear secretions            Ears- Hearing, canals normal            Nose- Clear, No-Septal dev, mucus, polyps, erosion, perforation             Throat- Mallampati IV , mucosa clear , drainage- none, tonsils- atrophic Neck- flexible , trachea midline, no stridor , thyroid nl, carotid no bruit Chest - symmetrical excursion , unlabored           Heart/CV- RRR , no murmur , no gallop  , no rub, nl s1 s2                           -  JVD- none , edema- none, stasis changes- none, varices- none           Lung- clear to P&A, wheeze- none, cough- none , dullness-none, rub- none   Unlabored           Chest wall-  Abd- tender-no, distended-no, bowel sounds-present, HSM- no Br/ Gen/ Rectal- Not done, not indicated Extrem- cyanosis- none, clubbing, none, atrophy- none, strength- nl    Homan's negative Neuro- grossly intact to observation         Assessment & Plan:

## 2011-01-18 NOTE — Assessment & Plan Note (Addendum)
Some of what she feels is normal response to outside air on hot, humid days Some maybe/ vague chest discomfort from her esophagitis, to be treated with regular Zegerid for a trial period. We will give neb and depo today Doubt PE, but will get D-dimer.

## 2011-01-18 NOTE — Assessment & Plan Note (Signed)
No active Sarcoid

## 2011-01-18 NOTE — Patient Instructions (Signed)
Use your Zegerid daily for at least 2 weeks trial  To see if reflux esophagitis is part of the problem.   Neb albut      Depo 80  Order lab- D-dimer      Dx dyspnea, asthma

## 2011-01-18 NOTE — Assessment & Plan Note (Addendum)
This might cause the vague chest tightness she describes- will treat with regular dose of Zegerid for a few weeks and see if that helps. GERD precautions reviewed.

## 2011-01-21 ENCOUNTER — Encounter: Payer: Self-pay | Admitting: Internal Medicine

## 2011-03-07 ENCOUNTER — Ambulatory Visit: Payer: Commercial Indemnity | Admitting: Internal Medicine

## 2011-03-08 LAB — COMPREHENSIVE METABOLIC PANEL
ALT: 108 — ABNORMAL HIGH
AST: 72 — ABNORMAL HIGH
Albumin: 4.2
CO2: 25
Calcium: 9.2
Chloride: 105
Creatinine, Ser: 1
GFR calc Af Amer: >60
GFR calc non Af Amer: 58 — ABNORMAL LOW
Sodium: 139
Total Bilirubin: 0.9

## 2011-03-08 LAB — URINALYSIS, ROUTINE W REFLEX MICROSCOPIC
Glucose, UA: NEGATIVE
Nitrite: NEGATIVE
pH: 7.5

## 2011-03-08 LAB — URINE MICROSCOPIC-ADD ON

## 2011-03-08 LAB — DIFFERENTIAL
Eosinophils Absolute: 0
Eosinophils Relative: 0
Lymphocytes Relative: 11 — ABNORMAL LOW
Lymphs Abs: 0.7
Monocytes Absolute: 0.3

## 2011-03-08 LAB — CBC
Hemoglobin: 13.6
MCHC: 34.9
MCV: 87.9
RDW: 12.5

## 2011-04-16 ENCOUNTER — Other Ambulatory Visit: Payer: Self-pay | Admitting: Obstetrics and Gynecology

## 2011-04-16 DIAGNOSIS — E049 Nontoxic goiter, unspecified: Secondary | ICD-10-CM

## 2011-04-17 ENCOUNTER — Other Ambulatory Visit: Payer: Self-pay | Admitting: Family Medicine

## 2011-04-17 DIAGNOSIS — E049 Nontoxic goiter, unspecified: Secondary | ICD-10-CM

## 2011-04-23 ENCOUNTER — Other Ambulatory Visit: Payer: Commercial Indemnity

## 2011-04-24 ENCOUNTER — Ambulatory Visit
Admission: RE | Admit: 2011-04-24 | Discharge: 2011-04-24 | Disposition: A | Discharge status: Home / Self Care | Payer: Managed Care, Other (non HMO) | Source: Ambulatory Visit | Attending: Family Medicine | Admitting: Family Medicine

## 2011-04-24 DIAGNOSIS — E049 Nontoxic goiter, unspecified: Secondary | ICD-10-CM

## 2011-05-28 HISTORY — PX: BREAST LUMPECTOMY: SHX2

## 2011-06-27 ENCOUNTER — Other Ambulatory Visit: Payer: Self-pay | Admitting: Endocrinology

## 2011-06-27 DIAGNOSIS — E041 Nontoxic single thyroid nodule: Secondary | ICD-10-CM

## 2011-07-03 ENCOUNTER — Other Ambulatory Visit (HOSPITAL_COMMUNITY)
Admission: RE | Admit: 2011-07-03 | Discharge: 2011-07-03 | Disposition: A | Discharge status: Home / Self Care | Payer: Managed Care, Other (non HMO) | Source: Ambulatory Visit | Attending: Interventional Radiology | Admitting: Interventional Radiology

## 2011-07-03 ENCOUNTER — Ambulatory Visit
Admission: RE | Admit: 2011-07-03 | Discharge: 2011-07-03 | Disposition: A | Discharge status: Home / Self Care | Payer: Managed Care, Other (non HMO) | Source: Ambulatory Visit | Attending: Endocrinology | Admitting: Endocrinology

## 2011-07-03 DIAGNOSIS — E041 Nontoxic single thyroid nodule: Secondary | ICD-10-CM

## 2011-07-03 DIAGNOSIS — E049 Nontoxic goiter, unspecified: Secondary | ICD-10-CM | POA: Insufficient documentation

## 2011-10-24 ENCOUNTER — Other Ambulatory Visit: Payer: Self-pay | Admitting: Obstetrics and Gynecology

## 2011-10-24 DIAGNOSIS — Z1231 Encounter for screening mammogram for malignant neoplasm of breast: Secondary | ICD-10-CM

## 2011-11-04 ENCOUNTER — Ambulatory Visit
Admission: RE | Admit: 2011-11-04 | Discharge: 2011-11-04 | Disposition: A | Discharge status: Home / Self Care | Payer: Managed Care, Other (non HMO) | Source: Ambulatory Visit | Attending: Obstetrics and Gynecology | Admitting: Obstetrics and Gynecology

## 2011-11-04 DIAGNOSIS — Z1231 Encounter for screening mammogram for malignant neoplasm of breast: Secondary | ICD-10-CM

## 2011-11-06 ENCOUNTER — Other Ambulatory Visit: Payer: Self-pay | Admitting: Obstetrics and Gynecology

## 2011-11-06 DIAGNOSIS — R928 Other abnormal and inconclusive findings on diagnostic imaging of breast: Secondary | ICD-10-CM

## 2011-11-15 ENCOUNTER — Other Ambulatory Visit: Payer: Self-pay | Admitting: Obstetrics and Gynecology

## 2011-11-15 ENCOUNTER — Ambulatory Visit
Admission: RE | Admit: 2011-11-15 | Discharge: 2011-11-15 | Disposition: A | Discharge status: Home / Self Care | Payer: Managed Care, Other (non HMO) | Source: Ambulatory Visit | Attending: Obstetrics and Gynecology | Admitting: Obstetrics and Gynecology

## 2011-11-15 DIAGNOSIS — R928 Other abnormal and inconclusive findings on diagnostic imaging of breast: Secondary | ICD-10-CM

## 2011-11-18 ENCOUNTER — Other Ambulatory Visit: Payer: Self-pay | Admitting: Obstetrics and Gynecology

## 2011-11-18 DIAGNOSIS — C50912 Malignant neoplasm of unspecified site of left female breast: Secondary | ICD-10-CM

## 2011-11-21 ENCOUNTER — Telehealth: Payer: Self-pay | Admitting: *Deleted

## 2011-11-21 ENCOUNTER — Other Ambulatory Visit: Payer: Self-pay | Admitting: *Deleted

## 2011-11-21 DIAGNOSIS — C50412 Malignant neoplasm of upper-outer quadrant of left female breast: Secondary | ICD-10-CM | POA: Insufficient documentation

## 2011-11-21 DIAGNOSIS — C50419 Malignant neoplasm of upper-outer quadrant of unspecified female breast: Secondary | ICD-10-CM | POA: Insufficient documentation

## 2011-11-21 DIAGNOSIS — D0510 Intraductal carcinoma in situ of unspecified breast: Secondary | ICD-10-CM

## 2011-11-21 NOTE — Telephone Encounter (Signed)
Confirmed [facility] for 11/27/11 at 0800 .  Instructions and contact information given.  

## 2011-11-26 ENCOUNTER — Ambulatory Visit
Admission: RE | Admit: 2011-11-26 | Discharge: 2011-11-26 | Disposition: A | Discharge status: Home / Self Care | Payer: Managed Care, Other (non HMO) | Source: Ambulatory Visit | Attending: Obstetrics and Gynecology | Admitting: Obstetrics and Gynecology

## 2011-11-26 DIAGNOSIS — C50912 Malignant neoplasm of unspecified site of left female breast: Secondary | ICD-10-CM

## 2011-11-26 MED ORDER — GADOBENATE DIMEGLUMINE 529 MG/ML IV SOLN
18.0000 mL | Freq: Once | INTRAVENOUS | Status: AC | PRN
  Administered 2011-11-26: 18 mL via INTRAVENOUS

## 2011-11-27 ENCOUNTER — Encounter: Payer: Self-pay | Admitting: Oncology

## 2011-11-27 ENCOUNTER — Ambulatory Visit (HOSPITAL_BASED_OUTPATIENT_CLINIC_OR_DEPARTMENT_OTHER): Payer: Managed Care, Other (non HMO) | Admitting: Genetic Counselor

## 2011-11-27 ENCOUNTER — Other Ambulatory Visit (HOSPITAL_BASED_OUTPATIENT_CLINIC_OR_DEPARTMENT_OTHER): Payer: Managed Care, Other (non HMO) | Admitting: Lab

## 2011-11-27 ENCOUNTER — Encounter: Payer: Self-pay | Admitting: *Deleted

## 2011-11-27 ENCOUNTER — Ambulatory Visit: Payer: Managed Care, Other (non HMO)

## 2011-11-27 ENCOUNTER — Ambulatory Visit
Admission: RE | Admit: 2011-11-27 | Discharge: 2011-11-27 | Disposition: A | Discharge status: Home / Self Care | Payer: Managed Care, Other (non HMO) | Source: Ambulatory Visit | Attending: Radiation Oncology | Admitting: Radiation Oncology

## 2011-11-27 ENCOUNTER — Ambulatory Visit (HOSPITAL_BASED_OUTPATIENT_CLINIC_OR_DEPARTMENT_OTHER): Payer: Managed Care, Other (non HMO) | Admitting: Oncology

## 2011-11-27 ENCOUNTER — Encounter: Payer: Self-pay | Admitting: Specialist

## 2011-11-27 ENCOUNTER — Encounter: Payer: Self-pay | Admitting: Genetic Counselor

## 2011-11-27 ENCOUNTER — Ambulatory Visit (HOSPITAL_BASED_OUTPATIENT_CLINIC_OR_DEPARTMENT_OTHER): Payer: Managed Care, Other (non HMO) | Admitting: General Surgery

## 2011-11-27 ENCOUNTER — Telehealth: Payer: Self-pay | Admitting: *Deleted

## 2011-11-27 VITALS — BP 149/87 | HR 73 | Temp 98.6°F | Ht 63.0 in | Wt 191.8 lb

## 2011-11-27 DIAGNOSIS — C50419 Malignant neoplasm of upper-outer quadrant of unspecified female breast: Secondary | ICD-10-CM

## 2011-11-27 DIAGNOSIS — Z803 Family history of malignant neoplasm of breast: Secondary | ICD-10-CM

## 2011-11-27 DIAGNOSIS — Z801 Family history of malignant neoplasm of trachea, bronchus and lung: Secondary | ICD-10-CM

## 2011-11-27 DIAGNOSIS — Z171 Estrogen receptor negative status [ER-]: Secondary | ICD-10-CM

## 2011-11-27 DIAGNOSIS — Z800 Family history of malignant neoplasm of digestive organs: Secondary | ICD-10-CM

## 2011-11-27 DIAGNOSIS — D0510 Intraductal carcinoma in situ of unspecified breast: Secondary | ICD-10-CM

## 2011-11-27 DIAGNOSIS — D0590 Unspecified type of carcinoma in situ of unspecified breast: Secondary | ICD-10-CM

## 2011-11-27 LAB — CBC WITH DIFFERENTIAL/PLATELET
BASO%: 1.9 % (ref 0.0–2.0)
EOS%: 2.9 % (ref 0.0–7.0)
HCT: 41.2 % (ref 34.8–46.6)
LYMPH%: 33.7 % (ref 14.0–49.7)
MCH: 30.6 pg (ref 25.1–34.0)
MCHC: 34.4 g/dL (ref 31.5–36.0)
MONO%: 7 % (ref 0.0–14.0)
NEUT%: 54.5 % (ref 38.4–76.8)
Platelets: 232 10*3/uL (ref 145–400)
RBC: 4.63 10*6/uL (ref 3.70–5.45)

## 2011-11-27 LAB — COMPREHENSIVE METABOLIC PANEL
ALT: 29 U/L (ref 0–35)
AST: 18 U/L (ref 0–37)
Alkaline Phosphatase: 77 U/L (ref 39–117)
Creatinine, Ser: 0.93 mg/dL (ref 0.50–1.10)
Sodium: 136 mEq/L (ref 135–145)
Total Bilirubin: 0.4 mg/dL (ref 0.3–1.2)

## 2011-11-27 NOTE — Addendum Note (Signed)
Encounter addended by: Maryln Gottron, MD on: 11/27/2011 11:56 AM<BR>     Documentation filed: Notes Section

## 2011-11-27 NOTE — Progress Notes (Signed)
Subjective:   New Dx left breast cancer  Patient ID: Hannah Nicholson, female   DOB: 09/17/1950, 61 y.o.   MRN: [id number]  HPI The patient is a very pleasant 61 year old female seen in the breast multidisciplinary clinic for a new diagnosis of left breast cancer. The patient has a positive family history of breast cancer in her sister and has therefore undergone routine screening for a number of years. 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 However recent screening mammogram revealed a new cluster of microcalcifications in the upper outer quadrant of the left breast and diagnostic workup was recommended. Diagnostic mammogram confirmed a small cluster of calcifications in the lateral posterior left breast. Stereotactic biopsy was recommended and performed. 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 Prognostic panel shows the tumor to be ER and PR negative but strongly HER-2 positive. The patient has not noted any breast symptoms, specifically no lobe, skin changes, nipple discharge, or pain.  Past Medical History  Diagnosis Date  . Esophageal reflux   . Sarcoidosis   . Unspecified asthma   . Allergic rhinitis, cause unspecified   . HTN (hypertension)   . Hypothyroidism    Past Surgical History  Procedure Date  . Total abdominal hysterectomy   . Tonsillectomy   . Tubal ligation   . Cholecystectomy   . Sinus surgery   . Hemorrhoid surgery    Current Outpatient Prescriptions  Medication Sig Dispense Refill  . amLODipine (NORVASC) 5 MG tablet Take 1 tablet by mouth Daily.      . calcium citrate (CALCITRATE - DOSED IN MG ELEMENTAL CALCIUM) 950 MG tablet Take 1 tablet by mouth daily.        . ergocalciferol (VITAMIN D2) 50000 UNITS capsule Take 50,000 Units by mouth once a week.        . fexofenadine (ALLEGRA) 180 MG tablet Take 180 mg by mouth daily.        .  fluticasone (FLONASE) 50 MCG/ACT nasal spray       . levothyroxine (SYNTHROID, LEVOTHROID) 50 MCG tablet Take 50 mcg by mouth daily.        . Multiple Vitamin (MULTIVITAMIN) capsule Take 1 capsule by mouth daily.        Marland Kitchen omeprazole-sodium bicarbonate (ZEGERID) 40-1100 MG per capsule Take 1 capsule by mouth daily before breakfast.        . PATADAY 0.2 % SOLN        No current facility-administered medications for this visit.   Facility-Administered Medications Ordered in Other Visits  Medication Dose Route Frequency Provider Last Rate Last Dose  . gadobenate dimeglumine (MULTIHANCE) injection 18 mL  18 mL Intravenous Once PRN Medication Radiologist, MD   18 mL at 11/26/11 1057   Allergies  Allergen Reactions  . Ivp Dye (Iodinated Diagnostic Agents) Other (See Comments)    Passes out   History  Substance Use Topics  . Smoking status: Never Smoker   . Smokeless tobacco: Not on file  . Alcohol Use: No   family history includes Asthma in her father and Heart disease in her mother. As well as breast cancer in her sister  Review of Systems  Constitutional: Positive for fatigue. Negative for fever, chills and unexpected weight change.  HENT: Negative.   Respiratory: Negative.   Cardiovascular: Negative.   Gastrointestinal: Negative.   Genitourinary: Negative.   Musculoskeletal: Negative.   Psychiatric/Behavioral: The patient is  nervous/anxious.        Objective:   Physical Exam General: Alert, well-developed African American female, in no distress Skin: Warm and dry without rash or infection. HEENT: No palpable masses or thyromegaly. Sclera nonicteric. Pupils equal round and reactive. Oropharynx clear. Lymph nodes: No cervical, supraclavicular, or inguinal nodes palpable. Breasts: Healing needle biopsy site in left upper quadrant of left breast. The breasts are large bilaterally. There are no palpable masses or skin changes or nipple inversion in either breast Lungs: Breath  sounds clear and equal without increased work of breathing Cardiovascular: Regular rate and rhythm without murmur. No JVD or edema. Peripheral pulses intact. Abdomen: Nondistended. Soft and nontender. No masses palpable. No organomegaly. No palpable hernias. Extremities: No edema or joint swelling or deformity. No chronic venous stasis changes. Neurologic: Alert and fully oriented. Gait normal.     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 We discussed initial surgical treatment option in detail today. She would be a good candidate for breast conservation which is what she would prefer. We discussed the option of mastectomy which is not clearly indicated and she prefers breast conservation. We discussed needle localized left breast lumpectomy and sentinel lymph node biopsy. We discussed the indications for the procedures and risks of bleeding, infection, anesthetic complications, slight risk of lymphedema, and possible need for further surgery based on final pathology. All her questions were answered.    Plan:     Needle localized left breast lumpectomy with left axillary sentinel lymph node biopsy under general anesthesia as an outpatient. We will schedule her surgery appendectomy on 2 weeks to allow for the results of her genetic testing.

## 2011-11-27 NOTE — Progress Notes (Signed)
Mailed after appt letter to pt. 

## 2011-11-27 NOTE — Progress Notes (Addendum)
[facility] Radiation Oncology NEW PATIENT EVALUATION  Name: Hannah Nicholson MRN: [id number]  Date:   11/27/2011           DOB: 10/23/1950  Status: outpatient   CC:   Mariella Saa, MD , Dr. 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 female who is seen today at the [facility] 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 At the time of a screening mammogram on 11/04/2011 she was noted to have calcifications in the left breast for which a diagnostic mammogram was obtained on 11/15/2011 showing worrisome calcifications in the lateral posterior left breast suspicious for DCIS. Stereotactic biopsy on 11/15/2011 was diagnostic for high-grade DCIS with necrosis with a microscopic focus of invasive ductal carcinoma. The focus was less than 0.1 cm. The prognostic profile showed your to be 5%, PR to be 0 with a proliferation marker of 22%. HER-2/neu was amplified at 4.75. I'm not sure if this was based on the DCIS and/or microscopic focus of invasive ductal carcinoma. Breast MR on 11/26/2011 showed a 1.0 x 1.0 x 0.9 cm area of low-grade enhancement surrounding the biopsy marker deep in the upper outer quadrant of the left breast. There was also a vertically oriented linear biopsy tract, more posteriorly medially measuring 3.1 x 1.2 x 1.1 cm. The patient is seen today with Dr. Johna Sheriff in Dr. Donnie Coffin.  PREVIOUS RADIATION THERAPY: No   PAST MEDICAL HISTORY:  has a past medical history of Esophageal reflux; Sarcoidosis; Unspecified asthma; Allergic rhinitis, cause unspecified; HTN (hypertension); and Hypothyroidism.     PAST SURGICAL HISTORY:  Past Surgical History  Procedure Date  . Total abdominal hysterectomy   . Tonsillectomy   . Tubal ligation   .  Cholecystectomy   . Sinus surgery   . Hemorrhoid surgery      FAMILY HISTORY: family history includes Asthma in her father and Heart disease in her mother. Her mother died from old age/congestive heart failure at age 20. Her father died from complications of COPD is 69. A sister was diagnosed with breast cancer at 5.   SOCIAL HISTORY:  reports that she has never smoked. She does not have any smokeless tobacco history on file. She reports that she does not drink alcohol or use illicit drugs. Would've for the past year, previously divorced. She has 3 sons. She does have a significant other. 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   ALLERGIES: Ivp dye   MEDICATIONS:  Current Outpatient Prescriptions  Medication Sig Dispense Refill  . amLODipine (NORVASC) 5 MG tablet Take 1 tablet by mouth Daily.      . calcium citrate (CALCITRATE - DOSED IN MG ELEMENTAL CALCIUM) 950 MG tablet Take 1 tablet by mouth daily.        . ergocalciferol (VITAMIN D2) 50000 UNITS capsule Take 50,000 Units by mouth once a week.        . fexofenadine (ALLEGRA) 180 MG tablet Take 180 mg by mouth daily.        . fluticasone (FLONASE) 50 MCG/ACT nasal spray       . levothyroxine (SYNTHROID, LEVOTHROID) 50 MCG tablet Take 50 mcg by mouth daily.        . Multiple Vitamin (MULTIVITAMIN) capsule Take 1 capsule by mouth  daily.        . omeprazole-sodium bicarbonate (ZEGERID) 40-1100 MG per capsule Take 1 capsule by mouth daily before breakfast.        . PATADAY 0.2 % SOLN          REVIEW OF SYSTEMS:  Pertinent items are noted in HPI.    PHYSICAL EXAM: Alert and oriented 61 year old after American female appearing younger than her stated age. Vital signs: Wt Readings from Last 3 Encounters:  11/27/11 191 lb 12.8 oz (87 kg)  01/18/11 195 lb 3.2 oz (88.542 kg)  11/19/10 196 lb (88.905 kg)   Temp Readings from Last 3 Encounters:  11/27/11 98.6 F (37 C) Oral   BP Readings from Last 3 Encounters:  11/27/11  149/87  01/18/11 124/76  11/19/10 122/76   Pulse Readings from Last 3 Encounters:  11/27/11 73  01/18/11 68  11/19/10 81   Head and neck examination: Grossly unremarkable. Nodes: Without palpable cervical, supraclavicular, or axillary lymphadenopathy. Chest: Lungs clear. Back: Without spinal or CVA tenderness. Heart: Regular rate rhythm. Breasts: There is a punctate needle biopsy wound along the superior aspect of her left breast. No masses are appreciated. Right breast without masses or lesions. Abdomen without hepatomegaly. Extremities: Without edema. Neurologic examination: Grossly nonfocal.    LABORATORY DATA:  Lab Results  Component Value Date   WBC 3.5* 11/27/2011   HGB 14.1 11/27/2011   HCT 41.2 11/27/2011   MCV 88.9 11/27/2011   PLT 232 11/27/2011   Lab Results  Component Value Date   NA 136 11/27/2011   K 3.5 11/27/2011   CL 101 11/27/2011   CO2 27 11/27/2011   Lab Results  Component Value Date   ALT 29 11/27/2011   AST 18 11/27/2011   ALKPHOS 77 11/27/2011   BILITOT 0.4 11/27/2011      IMPRESSION: Clinical stage I (T76mic N0 M0) microinvasive ductal/DCIS of the left breast. I explained to the patient and her family that her local treatment options include mastectomy versus partial mastectomy in addition to a sentinel lymph node followed by radiation therapy. She desires breast preservation. I discussed the potential acute and late toxicities of radiation therapy. She would also be offered deep inspiration/breath-hold technology to avoid cardiac irradiation for her left-sided breast cancer. I would suggest we obtain a postoperative mammogram for 6 weeks following her surgery to confirm removal of all suspicious microcalcifications. She may be considered for hypo-fractionated or standard fractionation radiation therapy. Her prognosis appears to be excellent. Lastly, she may be seen for genetic counseling based on her family history.   PLAN: As discussed above. I would like to see her for a  followup visit in approximately one 3 weeks following her surgery. Final recommendations can be made at that time. I would then get her scheduled for a left breast mammogram to confirm removal of all suspicious microcalcifications.   I spent 40 minutes minutes face to face with the patient and more than 50% of that time was spent in counseling and/or coordination of care.

## 2011-11-27 NOTE — Progress Notes (Signed)
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

## 2011-11-27 NOTE — Progress Notes (Signed)
Dr.  Donnie Coffin requested a consultation for genetic counseling and risk assessment for Hannah Nicholson, a 61 y.o. female, for discussion of her personal and family history of breast cancer. She presents to clinic today to discuss the possibility of a genetic predisposition to cancer, and to further clarify her risks, as well as her family members' risks for cancer.   HISTORY OF PRESENT ILLNESS: In June 2013, at the age of 75, Hannah Nicholson was diagnosed with invasive ductal carcinoma of the breast.     Past Medical History  Diagnosis Date  . Esophageal reflux   . Sarcoidosis   . Unspecified asthma   . Allergic rhinitis, cause unspecified   . HTN (hypertension)   . Hypothyroidism     Past Surgical History  Procedure Date  . Total abdominal hysterectomy   . Tonsillectomy   . Tubal ligation   . Cholecystectomy   . Sinus surgery   . Hemorrhoid surgery     History  Substance Use Topics  . Smoking status: Never Smoker   . Smokeless tobacco: Not on file  . Alcohol Use: No    REPRODUCTIVE HISTORY AND PERSONAL RISK ASSESSMENT FACTORS: Menarche was at age 40.   Menopause at age 30. Uterus Intact: No Ovaries Intact: Yes [id number] , first live birth at age 54  She has not previously undergone treatment for infertility.   OCP use for 1 year   She has not used HRT in the past.    FAMILY HISTORY:  We obtained a detailed, 4-generation family history.  Significant diagnoses are listed below: Family History  Problem Relation Age of Onset  . Asthma Father   . Heart disease Mother   . Prostate cancer Brother 44  . Prostate cancer Maternal Uncle     dx 5s  . Lupus Sister   . Breast cancer Sister 58  . Stroke Sister 40  . Prostate cancer Maternal Uncle     dx 21s  . Lung cancer Cousin     thought to be due to sarcoidosis  The patient was diagnosed with breast cancer at age 48.  She was diagnosed with a goiter in her 30s, thyroid nodules at age 76, uterine fibroids for which  she had a hysterectomy in her late 49s, and has fibroadenoma's of the breast in the past.  She has seven sisters and one brother.  One sister died of lupus, a sister, Lady Gary, was diagnosed with breast cancer at age 67 and died at 42, and another sister, Garlan Fair, died of a stroke at age 7.  Her brother was diagnosed with prostate cancer at age 75.  Her mother died at age 75 of old age.  She had three brothers and four sisters.  Two brothers had prostate cancer, and an aunt's daughter was diagnosed with lung cancer, thought to be due to sarcoidosis.  There is no other reported cancer on either side of the family.  Patient's maternal ancestors are of African American descent, and paternal ancestors are of African American descent. There is no reported Ashkenazi Jewish ancestry. There is no known consanguinity.  GENETIC COUNSELING RISK ASSESSMENT, DISCUSSION, AND SUGGESTED FOLLOW UP: We reviewed the natural history and genetic etiology of sporadic, familial and hereditary cancer syndromes.   About 5-10% of breast cancer is hereditary.  Of this, about 85% is the result of a BRCA1 or BRCA2 mutation.  We reviewed the red flags of hereditary cancer syndromes and the dominant inheritance patterns.  Her family history  of breast cancer does not meet the insurance criteria for hereditary breast and ovarian cancer syndrome.  Her personal history of breast cancer, thyroid nodules, Goiter, uterine fibroids and fibroadenoma's can be associated with PTEN mutations.  We discussed the symptoms associated with PTEN mutations.  According to the PTEN risk calculator, she has up to a 1.6% risk for a mutation.  The patient's personal history of breast cancer is suggestive of the following possible diagnosis: familial breast cancer  We discussed that identification of a hereditary cancer syndrome may help her care providers tailor the patients medical management. If a mutation indicating BRCA1 or BRCA2 is detected in this case, the  [employer] recommendations would include increased cancer surveillance and possible prophylactic sugery. If a mutation is detected, the patient will be referred back to the referring provider and to any additional appropriate care providers to discuss the relevant options.   If a mutation is not found in the patient, this will decrease the likelihood of a hereditary cancer syndroem as the explanation for her breast cancer. Cancer surveillance options would be discussed for the patient according to the appropriate standard National Comprehensive Cancer Network and American Cancer Society guidelines, with consideration of their personal and family history risk factors. In this case, the patient will be referred back to their care providers for discussions of management.   In order to estimate her chance of having a PTEN mutation, we used statistical models (PTEN Risk Calculator) and laboratory data that take into account her personal medical history, family history and ancestry.  Because each model is different, there can be a lot of variability in the risks they give.  Therefore, these numbers must be considered a rough range and not a precise risk of having a PTEN mutation.  These models estimate that she has approximately a 0.9-1.6% chance of having a mutation. Based on this assessment of her family and personal history, genetic testing is not recommended.  Based on the patient's family history and the PTEN risk calculator, her risk was not elevated enough for genetic testing.   We discussed that if her family history changed to please let us know, as this may change our risk evaluation.  The patient was seen for a total of 60 minutes, greater than 50% of which was spent face-to-face counseling.  This plan is being carried out per Dr. Renelda Loma recommendations.  This note will also be sent to the referring provider via the electronic medical record. The patient will be supplied  with a summary of this genetic counseling discussion as well as educational information on the discussed hereditary cancer syndromes following the conclusion of their visit.   Patient was discussed with Dr. Drue Second.  _______________________________________________________________________ For Office Staff:  Number of people involved in session: 4 Was an Intern/ student involved with case: not applicable

## 2011-11-27 NOTE — Progress Notes (Signed)
I met patient today at the multidisciplinary breast clinic.  While she marked her distress initially as a "9" on the screening, she indicated that she felt much relief after meeting with the physicians and receiving her treatment plan.  I gave her information on the Breast Cancer Support Group and at her request made a referral to [facility].

## 2011-11-27 NOTE — Telephone Encounter (Signed)
Made patient appointment to come back three weeks to see the md

## 2011-11-27 NOTE — Progress Notes (Signed)
Hannah Nicholson [id number] November 13, 1950 61 y.o. 11/27/2011 1:09 PM  CC Dr Salena Saner Janae Sauce   REASON FOR CONSULTATION:  Breast cancer Patient was seen in the Multidisciplinary Breast Clinic for discussion of her treatment options. She was seen by dr Pierce Crane, Radiation [facility]  STAGE:   Cancer of upper-outer quadrant of female breast   Primary site: Breast (Left)   Staging method: AJCC 7th Edition   Clinical: Stage IA (T85mic, N0, cM0)   Summary: Stage IA (T66mic, N0, cM0)  REFERRING PHYSICIAN: Dr Thomasene Mohair  HISTORY OF PRESENT ILLNESS:  Hannah Nicholson is a 61 y.o. female.  Who presents with an abnormal mammogram. She undergoes annual screening mammography. Her mammogram was performed on 11/04/2011. Calcifications were seen in the left breast. Additional views were recommended. A additional diagnostic mammogram and ultrasound was performed which essentially confirmed the presence of calcifications. These were biopsied on 11/15/2011. 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 Invasive cancer was ER +5% PR -0% proliferative index 22% HER-2 was amplified ratio 4.75. MRI of both breasts was performed on 11/26/2011 and this showed a area of enhancement measuring 1 x 1 x 0.9 cm. No other abnormalities were seen.   Past Medical History: Past Medical History  Diagnosis Date  . Esophageal reflux   . Sarcoidosis   . Unspecified asthma   . Allergic rhinitis, cause unspecified   . HTN (hypertension)   . Hypothyroidism.Marland Kitchen Hx of goiter     Past Surgical History: Past Surgical History  Procedure Date  . Total abdominal hysterectomy; ovaries in place , 1995   . Tonsillectomy   . Tubal ligation   . Cholecystectomy   1989/   . Sinus surgery   . Hemorrhoid surgery     Family History: Family History  Problem Relation Age of Onset  . Asthma Father   . Heart disease  Mother   1 sister with hx breast cancer, 1 brother with prostatae ca  Social History History  Substance Use Topics  . Smoking status: Never Smoker   . Smokeless tobacco: Not on file  . Alcohol Use: No   Patient is here today with her significant other Ed, pain she's been on for about 12 years. She was previously married for 30 years and is widowed. Her friend and was previously married as well as his wife died of breast cancer. 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 Allergies: Allergies  Allergen Reactions  . Ivp Dye (Iodinated Diagnostic Agents) Other (See Comments)    Passes out    Current Medications: Current Outpatient Prescriptions  Medication Sig Dispense Refill  . amLODipine (NORVASC) 5 MG tablet Take 1 tablet by mouth Daily.      . calcium citrate (CALCITRATE - DOSED IN MG ELEMENTAL CALCIUM) 950 MG tablet Take 1 tablet by mouth daily.        . ergocalciferol (VITAMIN D2) 50000 UNITS capsule Take 50,000 Units by mouth once a week.        . fexofenadine (ALLEGRA) 180 MG tablet Take 180 mg by mouth daily.        . fluticasone (FLONASE) 50 MCG/ACT nasal spray       . levothyroxine (SYNTHROID, LEVOTHROID) 50 MCG tablet Take 50 mcg by mouth daily.        . Multiple Vitamin (  MULTIVITAMIN) capsule Take 1 capsule by mouth daily.        Marland Kitchen omeprazole-sodium bicarbonate (ZEGERID) 40-1100 MG per capsule Take 1 capsule by mouth daily before breakfast.        . PATADAY 0.2 % SOLN         OB/GYN History: G4P3 Menarche 45 Fertility Discussion:n/a Prior History of Cancer:no  Health Maintenance:  Colonoscopy  yes Bone Density 8/10 Last PAP smearn/a  ECOG PERFORMANCE STATUS: 0 - Asymptomatic  Genetic Counseling/testing: yes-11/27/11  REVIEW OF SYSTEMS:  A comprehensive review of systems was negative.  PHYSICAL EXAMINATION: Blood pressure 149/87, pulse 73, temperature 98.6 F (37 C), temperature source  Oral, height 5\' 3"  (1.6 m), weight 191 lb 12.8 oz (87 kg).  HEENT:  Sclerae anicteric, conjunctivae pink.  Oropharynx clear.  No mucositis or candidiasis.  Nodes:  No cervical, supraclavicular, or axillary lymphadenopathy palpated.  Breast Exam:  Right breast is benign.  No masses, discharge, skin change, or nipple inversion.  Left breast is benign.  No masses, discharge, skin change, or nipple inversion..  Lungs:  Clear to auscultation bilaterally.  No crackles, rhonchi, or wheezes.  Heart:  Regular rate and rhythm.  Abdomen:  Soft, nontender.  Positive bowel sounds.  No organomegaly or masses palpated.  Musculoskeletal:  No focal spinal tenderness to palpation.  Extremities:  Benign.  No peripheral edema or cyanosis.  Skin:  Benign.  Neuro:  Nonfocal.    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  BUN and creatinine were obtained on site at [facility] at [address] Results:  BUN 11 mg/dL,  Creatinine 1.1 mg/dL.  BILATERAL BREAST MRI WITH AND WITHOUT CONTRAST  Technique: Multiplanar, multisequence MR images of both breasts were obtained prior to and following the intravenous administration of 18ml of MultiHance.  Three dimensional images were evaluated at the independent DynaCad workstation.  Comparison:  Previous imaging examinations, including the recent mammogram and biopsy examinations and previous breast MR dated 11/07/2005.  Findings: Mild background parenchymal enhancement both breasts.  Biopsy marker clip artifact deep in the upper outer quadrant of the left breast.  1.0 x 1.0 x 0.9 cm surrounding area of faint, low grade enhancement with persistent kinetics.  This corresponds to the location of the biopsied 6 mm cluster of microcalcifications.  There is an adjacent vertically oriented linear biopsy tract  slightly more posteriorly and medially, with low grade surrounding enhancement, measuring 3.1 x 1.2 x 1.1 cm.  The previously demonstrated fibroadenoma in the upper inner quadrant of the right breast is significantly smaller with significantly less enhancement.  An adjacent smaller fibroadenoma is unchanged.  No new findings suspicious for malignancy in the right breast or in the left breast other than the left breast post biopsy changes.  No abnormal appearing lymph nodes.  IMPRESSION:  1.  1.0 x 1.0 x 0.9 cm area of low grade enhancement surrounding the biopsy marker clip deep in the upper outer quadrant of the left breast.  This has an appearance most compatible with post biopsy change. 2.  3.1 x 1.2 x 1.1 cm area of post biopsy enhancement surrounding the biopsy tract in the left breast. 3.  No evidence of malignancy elsewhere in either breast.  RECOMMENDATION: Treatment plan  THREE-DIMENSIONAL MR IMAGE RENDERING ON INDEPENDENT WORKSTATION:  Three-dimensional MR images were rendered by post-processing of the  original MR data on an independent workstation.  The three- dimensional MR images were interpreted, and findings were reported in the accompanying complete MRI report for this study.  BI-RADS CATEGORY 6:  Known biopsy-proven malignancy - appropriate action should be taken.  Original Report Authenticated By: Darrol Angel, M.D.   Mm Breast Stereo Biopsy Left  11/18/2011  **ADDENDUM** CREATED: 11/18/2011 14:47:46  I spoke with the patient by telephone on 11/18/2011 at 2:50 p.m. Pathology demonstrates at least DCIS, additional stains are pending to determine whether an invasive component is present.  This is concordant with the imaging appearance.  The patient is scheduled for breast MRI 11/26/2011.  Multidisciplinary clinic at [facility] is scheduled 11/27/2011.  The patient reports no problems at the biopsy site overnight.  All questions were answered.  Addended by:  Harrel Lemon, M.D.  on 11/18/2011 14:47:46.  **END ADDENDUM** SIGNED BY: Harrel Lemon, M.D.   11/15/2011  *RADIOLOGY REPORT*  Clinical Data:  Suspicious calcifications left breast  STEREOTACTIC-GUIDED VACUUM ASSISTED BIOPSY OF THE LEFT BREAST AND SPECIMEN RADIOGRAPH  Comparison: Previous exams  I met with the patient and we discussed the procedure of stereotactic-guided biopsy, including benefits and alternatives. We discussed the high likelihood of a successful procedure. We discussed the risks of the procedure, including infection, bleeding, tissue injury, clip migration, and inadequate sampling. Informed, written consent was given.  Using sterile technique, 2% lidocaine, stereotactic guidance, and a 9 gauge vacuum assisted device, biopsy was performed of a cluster of calcifications in the lateral posterior left breast.  Specimen radiograph was performed, showing inclusion of calcification of concern.  Specimens with calcifications are identified for pathology.  At the conclusion of the procedure, a tissue marker clip was deployed into the biopsy cavity.  Follow-up 2-view mammogram confirmed clip in the area of concern.  IMPRESSION: Stereotactic-guided biopsy of left breast.  No apparent complications.  Original Report Authenticated By: Harrel Lemon, M.D.   Mm Breast Surgical Specimen  11/18/2011  **ADDENDUM** CREATED: 11/18/2011 14:47:46  I spoke with the patient by telephone on 11/18/2011 at 2:50 p.m. Pathology demonstrates at least DCIS, additional stains are pending to determine whether an invasive component is present.  This is concordant with the imaging appearance.  The patient is scheduled for breast MRI 11/26/2011.  Multidisciplinary clinic at [facility] is scheduled 11/27/2011.  The patient reports no problems at the biopsy site overnight.  All questions were answered.  Addended by:  Harrel Lemon, M.D. on 11/18/2011 14:47:46.  **END ADDENDUM** SIGNED BY: Harrel Lemon, M.D.   11/15/2011   *RADIOLOGY REPORT*  Clinical Data:  Suspicious calcifications left breast  STEREOTACTIC-GUIDED VACUUM ASSISTED BIOPSY OF THE LEFT BREAST AND SPECIMEN RADIOGRAPH  Comparison: Previous exams  I met with the patient and we discussed the procedure of stereotactic-guided biopsy, including benefits and alternatives. We discussed the high likelihood of a successful procedure. We discussed the risks of the procedure, including infection, bleeding, tissue injury, clip migration, and inadequate sampling. Informed, written consent was given.  Using sterile technique, 2% lidocaine, stereotactic guidance, and a 9 gauge vacuum assisted device, biopsy was performed of a cluster of calcifications in the lateral posterior left breast.  Specimen radiograph was performed, showing inclusion of calcification of concern.  Specimens with calcifications are identified for pathology.  At the conclusion of the procedure, a tissue marker clip was deployed into the biopsy cavity.  Follow-up 2-view mammogram confirmed clip in the area of concern.  IMPRESSION:  Stereotactic-guided biopsy of left breast.  No apparent complications.  Original Report Authenticated By: Harrel Lemon, M.D.   Mm Digital Diag Ltd L  11/15/2011  *RADIOLOGY REPORT*  Clinical Data:  Called back from screening mammogram for calcifications left breast  DIGITAL DIAGNOSTIC LEFT MAMMOGRAM November 15, 2011  Comparison:  November 04, 2011, November 01, 2010, October 30, 2009  Findings:  Spot magnification CC and lateral view of the left breast are submitted.  There is a cluster of calcifications in the lateral posterior left breast that are of varying sizes and shapes.  IMPRESSION: Suspicious findings.  RECOMMENDATION: Stereotactic core biopsy of left breast calcifications.  BI-RADS CATEGORY 4:  Suspicious abnormality - biopsy should be considered.  Original Report Authenticated By: Sherian Rein, M.D.   Mm Digital Screening  11/04/2011  *RADIOLOGY REPORT*  Clinical Data: Screening.   MAMMOGRAPHIC BILATERAL DIGITAL SCREENING WITH CAD  Findings: Two views of each breast demonstrate heterogeneously dense tissue .  In the left breast, calcifications warrant further evaluation with magnified views.  In the right breast, no mass or malignant type calcifications are identified.  Compared with priors.  Images were processed with CAD.  IMPRESSION: Further evaluation is suggested for calcifications in the left breast.  RECOMMENDATION: Diagnostic mammogram of the left breast. (Code:FI-L-38M)  BI-RADS CATEGORY 0:  Incomplete.  Need additional imaging evaluation and/or prior mammograms for comparison.  Original Report Authenticated By: Rolla Plate, M.D.     LABS:    Chemistry      Component Value Date/Time   NA 136 11/27/2011 0837   K 3.5 11/27/2011 0837   CL 101 11/27/2011 0837   CO2 27 11/27/2011 0837   BUN 16 11/27/2011 0837   CREATININE 0.93 11/27/2011 0837      Component Value Date/Time   CALCIUM 9.4 11/27/2011 0837   ALKPHOS 77 11/27/2011 0837   AST 18 11/27/2011 0837   ALT 29 11/27/2011 0837   BILITOT 0.4 11/27/2011 0837      Lab Results  Component Value Date   WBC 3.5* 11/27/2011   HGB 14.1 11/27/2011   HCT 41.2 11/27/2011   MCV 88.9 11/27/2011   PLT 232 11/27/2011       PATHOLOGY: As above  ASSESSMENT    61 year old with mammographically detected calcifications and evidence of DCIS on biopsy. There was also microinvasive disease seen which appears to be HER-2 positive.  Clinical Trial Eligibility:n/a Multidisciplinary conference discussion 11/27/2011    PLAN:    The patient was discussed today and conference. We discussed going ahead with lumpectomy and sentinel lymph node evaluation. I also discussed with the family and patient the option of receiving adjuvant treatment assuming that she has a measurement of invasive cancer. This will be determined at the time of lumpectomy. If Herceptin will be administered then a Port-A-Cath will be placed later on. I plan to see the  patient after she's had genetic testing, as well surgery and so this will likely be many women of 4-6 weeks from now. Patient will likely receive some form of adjuvant hormonal therapy as well. Did discuss that issue as well some of the potential side effects of that.       Discussion: Patient is being treated per NCCN breast cancer care guidelines appropriate for stage.I   Thank you so much for allowing me to participate in the care of WILLER OSORNO. I will continue to follow up the patient with you and assist in her care.  All questions were answered. The  patient knows to call the clinic with any problems, questions or concerns. We can certainly see the patient much sooner if necessary.  I spent 55 minutes counseling the patient face to face. The total time spent in the appointment was 25 minutes.  Pierce Crane M.D. FRCP C. 11/27/2011, 1:09 PM

## 2011-12-03 ENCOUNTER — Encounter: Payer: Self-pay | Admitting: *Deleted

## 2011-12-03 ENCOUNTER — Telehealth: Payer: Self-pay | Admitting: *Deleted

## 2011-12-03 NOTE — Telephone Encounter (Signed)
Spoke to pt concerning BMDC from 7/3.  Pt denies questions or concerns regarding dx or treatment care plan.  Encourage pt to call with further needs.  Received verbal understanding.  Contact information given.

## 2011-12-04 ENCOUNTER — Telehealth (INDEPENDENT_AMBULATORY_CARE_PROVIDER_SITE_OTHER): Payer: Self-pay | Admitting: General Surgery

## 2011-12-04 NOTE — Telephone Encounter (Signed)
The patient contacted the office stating she is going to get a second opinion regarding her breast cancer.

## 2011-12-06 ENCOUNTER — Other Ambulatory Visit (INDEPENDENT_AMBULATORY_CARE_PROVIDER_SITE_OTHER): Payer: Self-pay | Admitting: General Surgery

## 2011-12-06 DIAGNOSIS — C50419 Malignant neoplasm of upper-outer quadrant of unspecified female breast: Secondary | ICD-10-CM

## 2011-12-24 ENCOUNTER — Ambulatory Visit: Admit: 2011-12-24 | Payer: Self-pay | Admitting: General Surgery

## 2011-12-24 ENCOUNTER — Other Ambulatory Visit (HOSPITAL_COMMUNITY): Payer: Managed Care, Other (non HMO)

## 2011-12-24 SURGERY — BREAST LUMPECTOMY WITH NEEDLE LOCALIZATION AND AXILLARY SENTINEL LYMPH NODE BX
Anesthesia: General | Laterality: Left

## 2011-12-25 ENCOUNTER — Ambulatory Visit: Payer: Managed Care, Other (non HMO) | Admitting: Family

## 2011-12-25 ENCOUNTER — Other Ambulatory Visit: Payer: Managed Care, Other (non HMO) | Admitting: Lab

## 2011-12-26 NOTE — Progress Notes (Signed)
Pt did not show for appt

## 2012-02-19 ENCOUNTER — Ambulatory Visit: Payer: Managed Care, Other (non HMO) | Attending: Radiation Oncology | Admitting: Physical Therapy

## 2012-02-19 DIAGNOSIS — M25519 Pain in unspecified shoulder: Secondary | ICD-10-CM | POA: Insufficient documentation

## 2012-02-19 DIAGNOSIS — R609 Edema, unspecified: Secondary | ICD-10-CM | POA: Insufficient documentation

## 2012-02-19 DIAGNOSIS — M24519 Contracture, unspecified shoulder: Secondary | ICD-10-CM | POA: Insufficient documentation

## 2012-02-19 DIAGNOSIS — C50919 Malignant neoplasm of unspecified site of unspecified female breast: Secondary | ICD-10-CM | POA: Insufficient documentation

## 2012-02-19 DIAGNOSIS — IMO0001 Reserved for inherently not codable concepts without codable children: Secondary | ICD-10-CM | POA: Insufficient documentation

## 2012-02-20 ENCOUNTER — Ambulatory Visit: Payer: Managed Care, Other (non HMO) | Admitting: Physical Therapy

## 2012-02-25 ENCOUNTER — Ambulatory Visit: Payer: Managed Care, Other (non HMO) | Attending: Radiation Oncology | Admitting: Physical Therapy

## 2012-02-25 DIAGNOSIS — C50919 Malignant neoplasm of unspecified site of unspecified female breast: Secondary | ICD-10-CM | POA: Insufficient documentation

## 2012-02-25 DIAGNOSIS — R609 Edema, unspecified: Secondary | ICD-10-CM | POA: Insufficient documentation

## 2012-02-25 DIAGNOSIS — IMO0001 Reserved for inherently not codable concepts without codable children: Secondary | ICD-10-CM | POA: Insufficient documentation

## 2012-02-25 DIAGNOSIS — M25519 Pain in unspecified shoulder: Secondary | ICD-10-CM | POA: Insufficient documentation

## 2012-02-25 DIAGNOSIS — M24519 Contracture, unspecified shoulder: Secondary | ICD-10-CM | POA: Insufficient documentation

## 2012-02-27 ENCOUNTER — Ambulatory Visit: Payer: Managed Care, Other (non HMO) | Admitting: Physical Therapy

## 2012-03-03 ENCOUNTER — Encounter: Payer: Managed Care, Other (non HMO) | Admitting: Physical Therapy

## 2012-03-05 ENCOUNTER — Encounter: Payer: Managed Care, Other (non HMO) | Admitting: Physical Therapy

## 2012-03-10 ENCOUNTER — Encounter: Payer: Managed Care, Other (non HMO) | Admitting: Physical Therapy

## 2012-03-12 ENCOUNTER — Encounter: Payer: Managed Care, Other (non HMO) | Admitting: Physical Therapy

## 2012-03-17 ENCOUNTER — Encounter: Payer: Managed Care, Other (non HMO) | Admitting: Physical Therapy

## 2012-03-19 ENCOUNTER — Encounter: Payer: Managed Care, Other (non HMO) | Admitting: Physical Therapy

## 2013-02-05 ENCOUNTER — Encounter: Payer: Self-pay | Admitting: Internal Medicine

## 2013-02-05 ENCOUNTER — Ambulatory Visit (INDEPENDENT_AMBULATORY_CARE_PROVIDER_SITE_OTHER): Payer: Managed Care, Other (non HMO) | Admitting: Internal Medicine

## 2013-02-05 VITALS — BP 128/74 | HR 71 | Ht 63.0 in | Wt 199.0 lb

## 2013-02-05 DIAGNOSIS — Z23 Encounter for immunization: Secondary | ICD-10-CM

## 2013-02-05 DIAGNOSIS — J45901 Unspecified asthma with (acute) exacerbation: Secondary | ICD-10-CM

## 2013-02-05 DIAGNOSIS — J309 Allergic rhinitis, unspecified: Secondary | ICD-10-CM

## 2013-02-05 MED ORDER — METHYLPREDNISOLONE ACETATE 80 MG/ML IJ SUSP
80.0000 mg | Freq: Once | INTRAMUSCULAR | Status: AC
  Administered 2013-02-05: 80 mg via INTRAMUSCULAR

## 2013-02-05 MED ORDER — FLUTICASONE PROPIONATE 50 MCG/ACT NA SUSP: NASAL | Status: DC

## 2013-02-05 MED ORDER — AZELASTINE-FLUTICASONE 137-50 MCG/ACT NA SUSP: 1.0000 | Freq: Every day | NASAL | Status: DC

## 2013-02-05 NOTE — Progress Notes (Signed)
Patient ID: Hannah Nicholson, female DOB: 07-09-1950, 62 y.o. MRN: [id number]  HPI  11/19/10- 42 yoF never smoker followed for sarcoid, allergic rhinitis, asthma, complicated by GERD  Last here 09/05/08-Note reviewed  Complains of burning, watering eyes over the past year. Has seen two eye doctors and has tried Fiserv, Sustain, and is also using Flonase for this, with little nasal discomfort. Eyes are worse at work. 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 Uses allegra and another antihistamine.  Admits a little post nasal drip. Denies nodes, rash, night sweats. Seems worse at work.   01/18/11- 60 yoF never smoker followed for sarcoid, allergic rhinitis, asthma, complicated by GERD  Eyes stopped burning when she stopped antihistamine which may have been overdrying, and then went out of town. Felt more comfortable while in [place], [place], compared with here. Came back 6 weeks ago and was comfortable at first. Now for 2 weeks has felt smothered with some cough, no wheeze, just winded. No pain. Maybe some palpitation w/o swelling, pain or aching in her legs. Now has postnasal drip. Not a cold. She took Equate allergy tablet- helped breathing a little. Proventil HFA has helped a little. Reflux is not obvious to her now and she takes Zegerid sometimes.  Office spirometry- normal spirometry FEV1 2.18/106% FEV1/FVC 0.73 FEF 25-75% 1.57/63%    02/05/13- 62 yoF never smoker followed for sarcoid, allergic rhinitis, asthma, complicated by GERD, hx L breast lumpectomy FOLLOWS FOR:  Complaint of itchy, watery and burning eyes. Also, some sinus pressure since 9/8. For one week eyes burn, nose Waters, throat sore, sinus pressure, chest tight. Allegra not helping.  ROS-see HPI Constitutional:   No-   weight loss, night sweats, fevers, chills, fatigue, lassitude. HEENT:   No-  headaches, difficulty swallowing, tooth/dental problems, sore throat,       No-  sneezing,  itching, ear ache, +nasal congestion, post nasal drip,  CV:  No-   chest pain, orthopnea, PND, swelling in lower extremities, anasarca, dizziness, palpitations Resp: No-   shortness of breath with exertion or at rest.              No-   productive cough,  + non-productive cough,  No- coughing up of blood.              No-   change in color of mucus.  No- wheezing.   Skin: No-   rash or lesions. GI:  No-   heartburn, indigestion, abdominal pain, nausea, vomiting, GU: N MS:  No-   joint pain or swelling.  . Neuro-     nothing unusual Psych:  No- change in mood or affect. No depression or anxiety.  No memory loss.  OBJ- Physical Exam General- Alert, Oriented, Affect-appropriate, Distress- none acute Skin- rash-none, lesions- none, excoriation- none Lymphadenopathy- none Head- atraumatic            Eyes- Gross vision intact, PERRLA, conjunctivae and secretions clear            Ears- Hearing, canals-normal            Nose- Clear, no-Septal dev, +mucus bridging, no-polyps, erosion, perforation             Throat- Mallampati III , mucosa clear , drainage- none, tonsils- atrophic Neck- flexible , trachea midline, no stridor , thyroid nl, carotid no bruit Chest - symmetrical excursion , unlabored           Heart/CV- RRR ,  no murmur , no gallop  , no rub, nl s1 s2                           - JVD- none , edema- none, stasis changes- none, varices- none           Lung- clear to P&A, wheeze- none, cough+dry , dullness-none, rub- none           Chest wall-  Abd-  Br/ Gen/ Rectal- Not done, not indicated Extrem- cyanosis- none, clubbing, none, atrophy- none, strength- nl Neuro- grossly intact to observation

## 2013-02-05 NOTE — Patient Instructions (Addendum)
Neb neo nasal  Depo 80  Flu vax  Sample Dymista nasal spray     Try 1-2 puffs each nostril once daily at bedtime Script for Flonase/ fluticasone   Please call as needed

## 2013-02-14 NOTE — Assessment & Plan Note (Signed)
Mild acute exacerbation corresponds to ragweed season Plan-Depo-Medrol

## 2013-02-14 NOTE — Assessment & Plan Note (Signed)
Acute seasonal exacerbation Plan-nasal decongestant nebulizer, Depo-Medrol, Flonase, flu vaccine

## 2013-07-17 ENCOUNTER — Encounter (HOSPITAL_BASED_OUTPATIENT_CLINIC_OR_DEPARTMENT_OTHER): Payer: Self-pay | Admitting: Emergency Medicine

## 2013-07-17 ENCOUNTER — Emergency Department (HOSPITAL_BASED_OUTPATIENT_CLINIC_OR_DEPARTMENT_OTHER): Payer: BC Managed Care – PPO

## 2013-07-17 ENCOUNTER — Emergency Department (HOSPITAL_BASED_OUTPATIENT_CLINIC_OR_DEPARTMENT_OTHER)
Admission: EM | Admit: 2013-07-17 | Discharge: 2013-07-17 | Disposition: A | Discharge status: Home / Self Care | Payer: BC Managed Care – PPO | Attending: Emergency Medicine | Admitting: Emergency Medicine

## 2013-07-17 DIAGNOSIS — N200 Calculus of kidney: Secondary | ICD-10-CM

## 2013-07-17 DIAGNOSIS — Z79899 Other long term (current) drug therapy: Secondary | ICD-10-CM | POA: Insufficient documentation

## 2013-07-17 DIAGNOSIS — Z8619 Personal history of other infectious and parasitic diseases: Secondary | ICD-10-CM | POA: Insufficient documentation

## 2013-07-17 DIAGNOSIS — J45909 Unspecified asthma, uncomplicated: Secondary | ICD-10-CM | POA: Insufficient documentation

## 2013-07-17 DIAGNOSIS — Z9851 Tubal ligation status: Secondary | ICD-10-CM | POA: Insufficient documentation

## 2013-07-17 DIAGNOSIS — E039 Hypothyroidism, unspecified: Secondary | ICD-10-CM | POA: Insufficient documentation

## 2013-07-17 DIAGNOSIS — R110 Nausea: Secondary | ICD-10-CM | POA: Insufficient documentation

## 2013-07-17 DIAGNOSIS — IMO0002 Reserved for concepts with insufficient information to code with codable children: Secondary | ICD-10-CM | POA: Insufficient documentation

## 2013-07-17 DIAGNOSIS — Z8719 Personal history of other diseases of the digestive system: Secondary | ICD-10-CM | POA: Insufficient documentation

## 2013-07-17 DIAGNOSIS — Z9089 Acquired absence of other organs: Secondary | ICD-10-CM | POA: Insufficient documentation

## 2013-07-17 DIAGNOSIS — Z9889 Other specified postprocedural states: Secondary | ICD-10-CM | POA: Insufficient documentation

## 2013-07-17 DIAGNOSIS — I10 Essential (primary) hypertension: Secondary | ICD-10-CM | POA: Insufficient documentation

## 2013-07-17 DIAGNOSIS — Z90710 Acquired absence of both cervix and uterus: Secondary | ICD-10-CM | POA: Insufficient documentation

## 2013-07-17 LAB — CBC WITH DIFFERENTIAL/PLATELET
Basophils Absolute: 0 10*3/uL (ref 0.0–0.1)
Basophils Relative: 1 % (ref 0–1)
EOS PCT: 1 % (ref 0–5)
Eosinophils Absolute: 0 10*3/uL (ref 0.0–0.7)
HEMATOCRIT: 40.7 % (ref 36.0–46.0)
Hemoglobin: 14 g/dL (ref 12.0–15.0)
LYMPHS ABS: 1 10*3/uL (ref 0.7–4.0)
Lymphocytes Relative: 15 % (ref 12–46)
MCH: 30.4 pg (ref 26.0–34.0)
MCHC: 34.4 g/dL (ref 30.0–36.0)
MCV: 88.5 fL (ref 78.0–100.0)
MONO ABS: 0.3 10*3/uL (ref 0.1–1.0)
Monocytes Relative: 5 % (ref 3–12)
NEUTROS ABS: 5.2 10*3/uL (ref 1.7–7.7)
Neutrophils Relative %: 79 % — ABNORMAL HIGH (ref 43–77)
PLATELETS: 205 10*3/uL (ref 150–400)
RBC: 4.6 MIL/uL (ref 3.87–5.11)
RDW: 12.4 % (ref 11.5–15.5)
WBC: 6.5 10*3/uL (ref 4.0–10.5)

## 2013-07-17 LAB — BASIC METABOLIC PANEL
BUN: 13 mg/dL (ref 6–23)
CALCIUM: 9.5 mg/dL (ref 8.4–10.5)
CHLORIDE: 100 meq/L (ref 96–112)
CO2: 24 meq/L (ref 19–32)
Creatinine, Ser: 1.1 mg/dL (ref 0.50–1.10)
GFR calc Af Amer: 62 mL/min — ABNORMAL LOW (ref >90)
GFR calc non Af Amer: 53 mL/min — ABNORMAL LOW (ref >90)
GLUCOSE: 171 mg/dL — AB (ref 70–99)
Potassium: 3.7 mEq/L (ref 3.7–5.3)
Sodium: 139 mEq/L (ref 137–147)

## 2013-07-17 LAB — URINE MICROSCOPIC-ADD ON

## 2013-07-17 LAB — URINALYSIS, ROUTINE W REFLEX MICROSCOPIC
Bilirubin Urine: NEGATIVE
Glucose, UA: 100 mg/dL — AB
Ketones, ur: NEGATIVE mg/dL
LEUKOCYTES UA: NEGATIVE
Nitrite: NEGATIVE
Protein, ur: NEGATIVE mg/dL
Specific Gravity, Urine: 1.012 (ref 1.005–1.030)
Urobilinogen, UA: 0.2 mg/dL (ref 0.0–1.0)
pH: 7.5 (ref 5.0–8.0)

## 2013-07-17 MED ORDER — KETOROLAC TROMETHAMINE 30 MG/ML IJ SOLN
30.0000 mg | Freq: Once | INTRAMUSCULAR | Status: AC
  Administered 2013-07-17: 30 mg via INTRAVENOUS

## 2013-07-17 MED ORDER — OXYCODONE-ACETAMINOPHEN 5-325 MG PO TABS: 2.0000 | ORAL_TABLET | ORAL | Status: DC | PRN

## 2013-07-17 MED ORDER — MORPHINE SULFATE 4 MG/ML IJ SOLN
INTRAMUSCULAR | Status: AC
  Administered 2013-07-17: 4 mg via INTRAVENOUS
  Filled 2013-07-17: qty 1

## 2013-07-17 MED ORDER — ONDANSETRON HCL 4 MG/2ML IJ SOLN
4.0000 mg | Freq: Once | INTRAMUSCULAR | Status: AC
  Administered 2013-07-17: 4 mg via INTRAVENOUS

## 2013-07-17 MED ORDER — MORPHINE SULFATE 4 MG/ML IJ SOLN
4.0000 mg | Freq: Once | INTRAMUSCULAR | Status: AC
  Administered 2013-07-17: 4 mg via INTRAVENOUS

## 2013-07-17 MED ORDER — ONDANSETRON 8 MG PO TBDP: ORAL_TABLET | ORAL | Status: DC

## 2013-07-17 MED ORDER — ONDANSETRON HCL 4 MG/2ML IJ SOLN
4.0000 mg | Freq: Once | INTRAMUSCULAR | Status: DC
  Filled 2013-07-17: qty 2

## 2013-07-17 MED ORDER — KETOROLAC TROMETHAMINE 30 MG/ML IJ SOLN
INTRAMUSCULAR | Status: AC
  Administered 2013-07-17: 30 mg via INTRAVENOUS
  Filled 2013-07-17: qty 1

## 2013-07-17 MED ORDER — SODIUM CHLORIDE 0.9 % IV BOLUS (SEPSIS)
1000.0000 mL | Freq: Once | INTRAVENOUS | Status: AC
  Administered 2013-07-17: 1000 mL via INTRAVENOUS

## 2013-07-17 NOTE — Discharge Instructions (Signed)
Kidney Stones  Kidney stones (urolithiasis) are deposits that form inside your kidneys. The intense pain is caused by the stone moving through the urinary tract. When the stone moves, the ureter goes into spasm around the stone. The stone is usually passed in the urine.   CAUSES   · A disorder that makes certain neck glands produce too much parathyroid hormone (primary hyperparathyroidism).  · A buildup of uric acid crystals, similar to gout in your joints.  · Narrowing (stricture) of the ureter.  · A kidney obstruction present at birth (congenital obstruction).  · Previous surgery on the kidney or ureters.  · Numerous kidney infections.  SYMPTOMS   · Feeling sick to your stomach (nauseous).  · Throwing up (vomiting).  · Blood in the urine (hematuria).  · Pain that usually spreads (radiates) to the groin.  · Frequency or urgency of urination.  DIAGNOSIS   · Taking a history and physical exam.  · Blood or urine tests.  · CT scan.  · Occasionally, an examination of the inside of the urinary bladder (cystoscopy) is performed.  TREATMENT   · Observation.  · Increasing your fluid intake.  · Extracorporeal shock wave lithotripsy This is a noninvasive procedure that uses shock waves to break up kidney stones.  · Surgery may be needed if you have severe pain or persistent obstruction. There are various surgical procedures. Most of the procedures are performed with the use of small instruments. Only small incisions are needed to accommodate these instruments, so recovery time is minimized.  The size, location, and chemical composition are all important variables that will determine the proper choice of action for you. Talk to your health care provider to better understand your situation so that you will minimize the risk of injury to yourself and your kidney.   HOME CARE INSTRUCTIONS   · Drink enough water and fluids to keep your urine clear or pale yellow. This will help you to pass the stone or stone fragments.  · Strain  all urine through the provided strainer. Keep all particulate matter and stones for your health care provider to see. The stone causing the pain may be as small as a grain of salt. It is very important to use the strainer each and every time you pass your urine. The collection of your stone will allow your health care provider to analyze it and verify that a stone has actually passed. The stone analysis will often identify what you can do to reduce the incidence of recurrences.  · Only take over-the-counter or prescription medicines for pain, discomfort, or fever as directed by your health care provider.  · Make a follow-up appointment with your health care provider as directed.  · Get follow-up X-rays if required. The absence of pain does not always mean that the stone has passed. It may have only stopped moving. If the urine remains completely obstructed, it can cause loss of kidney function or even complete destruction of the kidney. It is your responsibility to make sure X-rays and follow-ups are completed. Ultrasounds of the kidney can show blockages and the status of the kidney. Ultrasounds are not associated with any radiation and can be performed easily in a matter of minutes.  SEEK MEDICAL CARE IF:  · You experience pain that is progressive and unresponsive to any pain medicine you have been prescribed.  SEEK IMMEDIATE MEDICAL CARE IF:   · Pain cannot be controlled with the prescribed medicine.  · You have a fever   or shaking chills.  · The severity or intensity of pain increases over 18 hours and is not relieved by pain medicine.  · You develop a new onset of abdominal pain.  · You feel faint or pass out.  · You are unable to urinate.  MAKE SURE YOU:   · Understand these instructions.  · Will watch your condition.  · Will get help right away if you are not doing well or get worse.  Document Released: 05/13/2005 Document Revised: 01/13/2013 Document Reviewed: 10/14/2012  [employer]® Patient Information ©2014  [employer].

## 2013-07-17 NOTE — ED Provider Notes (Addendum)
CSN: [id number]     Arrival date & time 07/17/13  1431 History  This chart was scribed for Malvin Johns, MD by Zettie Pho, ED Scribe. This patient was seen in room MH08/MH08 and the patient's care was started at 3:27 PM.    Chief Complaint  Patient presents with  . Flank Pain   The history is provided by the patient. No language interpreter was used.   HPI Comments: Hannah Nicholson is a 63 y.o. female with a history of kidney stones ( last instance was in November 2014, or about 3 months ago) who presents to the Emergency Department complaining of a constant pain to the right flank with associated nausea onset yesterday that she states has been progressively worsening since this morning. She reports taking hydrocodone and Phenergan at home without significant relief. Patient states that her current symptoms feel similar to her prior kidney stones. Patient reports a history of lithotripsy several years ago, but has not followed up with a urologist recently. The last urologist that she saw was in [facility], but now she wants all of her doctors to be in [place].  She denies emesis, dysuria, hematuria, cough, chest pain, shortness of breath. Patient also has a history of HTN and hypothyroidism.   Past Medical History  Diagnosis Date  . Esophageal reflux   . Sarcoidosis   . Unspecified asthma(493.90)   . Allergic rhinitis, cause unspecified   . HTN (hypertension)   . Hypothyroidism    Past Surgical History  Procedure Laterality Date  . Total abdominal hysterectomy    . Tonsillectomy    . Tubal ligation    . Cholecystectomy    . Sinus surgery    . Hemorrhoid surgery     Family History  Problem Relation Age of Onset  . Asthma Father   . Heart disease Mother   . Prostate cancer Brother 62  . Prostate cancer Maternal Uncle     dx 52s  . Lupus Sister   . Breast cancer Sister 10  . Stroke Sister 28  . Prostate cancer Maternal Uncle     dx 22s  . Lung cancer Cousin     thought to be  due to sarcoidosis   History  Substance Use Topics  . Smoking status: Never Smoker   . Smokeless tobacco: Not on file  . Alcohol Use: No   OB History   Grav Para Term Preterm Abortions TAB SAB Ect Mult Living                 Review of Systems  Constitutional: Negative for fever, chills, diaphoresis and fatigue.  HENT: Negative for congestion, rhinorrhea and sneezing.   Eyes: Negative.   Respiratory: Negative for cough, chest tightness and shortness of breath.   Cardiovascular: Negative for chest pain and leg swelling.  Gastrointestinal: Positive for nausea. Negative for vomiting, abdominal pain, diarrhea and blood in stool.  Genitourinary: Positive for flank pain. Negative for dysuria, frequency, hematuria and difficulty urinating.  Musculoskeletal: Negative for arthralgias and back pain.  Skin: Negative for rash.  Neurological: Negative for dizziness, speech difficulty, weakness, numbness and headaches.    Allergies  Ivp dye  Home Medications   Current Outpatient Rx  Name  Route  Sig  Dispense  Refill  . HYDROcodone-acetaminophen (NORCO/VICODIN) 5-325 MG per tablet   Oral   Take 1 tablet by mouth every 6 (six) hours as needed for moderate pain.         . promethazine (  PHENERGAN) 25 MG tablet   Oral   Take 25 mg by mouth every 6 (six) hours as needed for nausea or vomiting.         Marland Kitchen amLODipine (NORVASC) 5 MG tablet   Oral   Take 1 tablet by mouth Daily.         . Azelastine-Fluticasone (DYMISTA) 137-50 MCG/ACT SUSP   Nasal   Place 1 spray into the nose daily.   6 g   0   . calcium citrate (CALCITRATE - DOSED IN MG ELEMENTAL CALCIUM) 950 MG tablet   Oral   Take 1 tablet by mouth daily.           . cycloSPORINE (RESTASIS) 0.05 % ophthalmic emulsion      1 drop 2 (two) times daily.         . ergocalciferol (VITAMIN D2) 50000 UNITS capsule   Oral   Take 50,000 Units by mouth once a week.           . fexofenadine (ALLEGRA) 180 MG tablet   Oral    Take 180 mg by mouth daily.           . fluticasone (FLONASE) 50 MCG/ACT nasal spray      1-2 puffs each nostril once or twice daily   16 g   prn   . levothyroxine (SYNTHROID, LEVOTHROID) 50 MCG tablet   Oral   Take 50 mcg by mouth daily.           . Multiple Vitamin (MULTIVITAMIN) capsule   Oral   Take 1 capsule by mouth daily.           . ondansetron (ZOFRAN ODT) 8 MG disintegrating tablet      8mg  ODT q4 hours prn nausea   10 tablet   0   . oxyCODONE-acetaminophen (PERCOCET) 5-325 MG per tablet   Oral   Take 2 tablets by mouth every 4 (four) hours as needed.   20 tablet   0    Triage Vitals: BP 153/87  Pulse 83  Temp(Src) 98.3 F (36.8 C) (Oral)  Resp 24  Ht 5\' 3"  (1.6 m)  Wt 192 lb (87.091 kg)  BMI 34.02 kg/m2  SpO2 100%  Physical Exam  Constitutional: She is oriented to person, place, and time. She appears well-developed and well-nourished.  HENT:  Head: Normocephalic and atraumatic.  Eyes: Pupils are equal, round, and reactive to light.  Neck: Normal range of motion. Neck supple.  Cardiovascular: Normal rate, regular rhythm and normal heart sounds.   Pulmonary/Chest: Effort normal and breath sounds normal. No respiratory distress. She has no wheezes. She has no rales. She exhibits no tenderness.  Abdominal: Soft. Bowel sounds are normal. There is tenderness. There is no rebound and no guarding.  Tenderness to the right mid-abdomen and right CVA area.   Musculoskeletal: Normal range of motion. She exhibits no edema.  Lymphadenopathy:    She has no cervical adenopathy.  Neurological: She is alert and oriented to person, place, and time.  Skin: Skin is warm and dry. No rash noted.  Psychiatric: She has a normal mood and affect.    ED Course  Procedures (including critical care time)  DIAGNOSTIC STUDIES: Oxygen Saturation is 100% on room air, normal by my interpretation.    COORDINATION OF CARE: 3:31 PM- Will order blood labs, UA, and a CT of  the abdomen. Will order IV fluids, Zofran, morphine, and Toradol to manage symptoms. Discussed treatment plan with patient at  bedside and patient verbalized agreement.   4:56 PM- Patient reports feeling much better after receiving the medications. Discussed that CT results indicate two kidney stones, one of which is very large and will likely not pass on its own. Advised patient to follow up with the referred urologist. Will discharge patient with pain and anti-nausea medication. Return precautions given. Discussed treatment plan with patient at bedside and patient verbalized agreement.    Labs Review Results for orders placed during the hospital encounter of 07/17/13  URINALYSIS, ROUTINE W REFLEX MICROSCOPIC      Result Value Ref Range   Color, Urine YELLOW  YELLOW   APPearance CLEAR  CLEAR   Specific Gravity, Urine 1.012  1.005 - 1.030   pH 7.5  5.0 - 8.0   Glucose, UA 100 (*) NEGATIVE mg/dL   Hgb urine dipstick LARGE (*) NEGATIVE   Bilirubin Urine NEGATIVE  NEGATIVE   Ketones, ur NEGATIVE  NEGATIVE mg/dL   Protein, ur NEGATIVE  NEGATIVE mg/dL   Urobilinogen, UA 0.2  0.0 - 1.0 mg/dL   Nitrite NEGATIVE  NEGATIVE   Leukocytes, UA NEGATIVE  NEGATIVE  CBC WITH DIFFERENTIAL      Result Value Ref Range   WBC 6.5  4.0 - 10.5 K/uL   RBC 4.60  3.87 - 5.11 MIL/uL   Hemoglobin 14.0  12.0 - 15.0 g/dL   HCT 40.7  36.0 - 46.0 %   MCV 88.5  78.0 - 100.0 fL   MCH 30.4  26.0 - 34.0 pg   MCHC 34.4  30.0 - 36.0 g/dL   RDW 12.4  11.5 - 15.5 %   Platelets 205  150 - 400 K/uL   Neutrophils Relative % 79 (*) 43 - 77 %   Neutro Abs 5.2  1.7 - 7.7 K/uL   Lymphocytes Relative 15  12 - 46 %   Lymphs Abs 1.0  0.7 - 4.0 K/uL   Monocytes Relative 5  3 - 12 %   Monocytes Absolute 0.3  0.1 - 1.0 K/uL   Eosinophils Relative 1  0 - 5 %   Eosinophils Absolute 0.0  0.0 - 0.7 K/uL   Basophils Relative 1  0 - 1 %   Basophils Absolute 0.0  0.0 - 0.1 K/uL  BASIC METABOLIC PANEL      Result Value Ref Range    Sodium 139  137 - 147 mEq/L   Potassium 3.7  3.7 - 5.3 mEq/L   Chloride 100  96 - 112 mEq/L   CO2 24  19 - 32 mEq/L   Glucose, Bld 171 (*) 70 - 99 mg/dL   BUN 13  6 - 23 mg/dL   Creatinine, Ser 1.10  0.50 - 1.10 mg/dL   Calcium 9.5  8.4 - 10.5 mg/dL   GFR calc non Af Amer 53 (*) >90 mL/min   GFR calc Af Amer 62 (*) >90 mL/min  URINE MICROSCOPIC-ADD ON      Result Value Ref Range   Squamous Epithelial / LPF RARE  RARE   WBC, UA 0-2  <3 WBC/hpf   RBC / HPF TOO NUMEROUS TO COUNT  <3 RBC/hpf   Bacteria, UA MANY (*) RARE   Ct Abdomen Pelvis Wo Contrast  07/17/2013   CLINICAL DATA:  Right flank pain  EXAM: CT ABDOMEN AND PELVIS WITHOUT CONTRAST  TECHNIQUE: Multidetector CT imaging of the abdomen and pelvis was performed following the standard protocol without intravenous contrast.  COMPARISON:  01/09/2007  FINDINGS: The lung bases  are clear. There is no pleural or pericardial effusion noted.  There is no focal liver abnormality identified. Previous cholecystectomy. No biliary dilatation. Normal appearance of the pancreas. The spleen is unremarkable.  Normal appearance of both adrenal glands. Stone within the midpole of left kidney measures full were mm, image 37/ series 3. No evidence for hydronephrosis or hydroureter. There is a nonobstructing calculus within the inferior pole of the right kidney measuring 1.1 cm, image 45/series 3. Right-sided hydronephrosis and hydroureter is noted. There are 2 stones within the distal right ureter. The largest measures 7 mm. The urinary bladder appears normal. Previous hysterectomy.  Normal caliber of the abdominal aorta. There is no aneurysm. No upper abdominal adenopathy identified. No pelvic or inguinal adenopathy identified.  No free fluid or fluid collections identified within the abdomen or pelvis. The stomach is normal. The small bowel loops have a normal course and caliber and there is no obstruction. The appendix is visualized and appears normal. Normal  appearance of the proximal colon. There is scattered distal colonic diverticula identified.  Review of the visualized osseous structures is significant for mild scoliosis deformity involving the lumbar spine.  IMPRESSION: 1. Right-sided hydronephrosis and hydroureter secondary to distal right ureteral calculi. These measure up to 7 mm. 2. Bilateral renal stones.   Electronically Signed   By: Kerby Moors M.D.   On: 07/17/2013 16:30     Imaging Review Ct Abdomen Pelvis Wo Contrast  07/17/2013   CLINICAL DATA:  Right flank pain  EXAM: CT ABDOMEN AND PELVIS WITHOUT CONTRAST  TECHNIQUE: Multidetector CT imaging of the abdomen and pelvis was performed following the standard protocol without intravenous contrast.  COMPARISON:  01/09/2007  FINDINGS: The lung bases are clear. There is no pleural or pericardial effusion noted.  There is no focal liver abnormality identified. Previous cholecystectomy. No biliary dilatation. Normal appearance of the pancreas. The spleen is unremarkable.  Normal appearance of both adrenal glands. Stone within the midpole of left kidney measures full were mm, image 37/ series 3. No evidence for hydronephrosis or hydroureter. There is a nonobstructing calculus within the inferior pole of the right kidney measuring 1.1 cm, image 45/series 3. Right-sided hydronephrosis and hydroureter is noted. There are 2 stones within the distal right ureter. The largest measures 7 mm. The urinary bladder appears normal. Previous hysterectomy.  Normal caliber of the abdominal aorta. There is no aneurysm. No upper abdominal adenopathy identified. No pelvic or inguinal adenopathy identified.  No free fluid or fluid collections identified within the abdomen or pelvis. The stomach is normal. The small bowel loops have a normal course and caliber and there is no obstruction. The appendix is visualized and appears normal. Normal appearance of the proximal colon. There is scattered distal colonic diverticula  identified.  Review of the visualized osseous structures is significant for mild scoliosis deformity involving the lumbar spine.  IMPRESSION: 1. Right-sided hydronephrosis and hydroureter secondary to distal right ureteral calculi. These measure up to 7 mm. 2. Bilateral renal stones.   Electronically Signed   By: Kerby Moors M.D.   On: 07/17/2013 16:30    EKG Interpretation   None       MDM   Final diagnoses:  Kidney stone    Patient presents with flank pain. She has evidence of 2 distal right ureteral stones. The largest is 7 mm. She's currently pain control. Her renal function is normal. She was given a urine strainer and a prescription for Percocet and Zofran. She was advised  to followup with a urologist as soon as possible return here as needed for any worsening or uncontrolled pain, ongoing vomiting or fevers. I also advised her that her blood sugar was elevated and that she needs to follow with her primary care physician regarding this.  I personally performed the services described in this documentation, which was scribed in my presence.  The recorded information has been reviewed and considered.     Malvin Johns, MD 07/17/13 [place], MD 07/17/13 [id number]

## 2013-07-17 NOTE — ED Notes (Signed)
Onset of right flank pain yesterday.  Worsening t/o the day.  History of kidney stone.  C/o nausea, no vomiting/fever.

## 2013-09-07 ENCOUNTER — Other Ambulatory Visit: Payer: Self-pay | Admitting: Urology

## 2013-09-09 ENCOUNTER — Encounter (HOSPITAL_COMMUNITY): Payer: Self-pay | Admitting: Pharmacy Technician

## 2013-09-09 ENCOUNTER — Encounter (HOSPITAL_COMMUNITY): Payer: Self-pay | Admitting: *Deleted

## 2013-09-16 ENCOUNTER — Ambulatory Visit (HOSPITAL_COMMUNITY)
Admission: RE | Admit: 2013-09-16 | Discharge: 2013-09-16 | Disposition: A | Discharge status: Home / Self Care | Payer: BC Managed Care – PPO | Source: Ambulatory Visit | Attending: Urology | Admitting: Urology

## 2013-09-16 ENCOUNTER — Encounter (HOSPITAL_COMMUNITY)
Admission: RE | Disposition: A | Discharge status: Home / Self Care | Payer: Self-pay | Source: Ambulatory Visit | Attending: Urology

## 2013-09-16 ENCOUNTER — Encounter (HOSPITAL_COMMUNITY): Payer: Self-pay | Admitting: *Deleted

## 2013-09-16 ENCOUNTER — Ambulatory Visit (HOSPITAL_COMMUNITY): Payer: BC Managed Care – PPO

## 2013-09-16 DIAGNOSIS — Z853 Personal history of malignant neoplasm of breast: Secondary | ICD-10-CM | POA: Insufficient documentation

## 2013-09-16 DIAGNOSIS — N200 Calculus of kidney: Secondary | ICD-10-CM | POA: Insufficient documentation

## 2013-09-16 DIAGNOSIS — J45909 Unspecified asthma, uncomplicated: Secondary | ICD-10-CM | POA: Insufficient documentation

## 2013-09-16 DIAGNOSIS — K219 Gastro-esophageal reflux disease without esophagitis: Secondary | ICD-10-CM | POA: Insufficient documentation

## 2013-09-16 DIAGNOSIS — Z79899 Other long term (current) drug therapy: Secondary | ICD-10-CM | POA: Insufficient documentation

## 2013-09-16 DIAGNOSIS — Z90710 Acquired absence of both cervix and uterus: Secondary | ICD-10-CM | POA: Insufficient documentation

## 2013-09-16 DIAGNOSIS — I10 Essential (primary) hypertension: Secondary | ICD-10-CM | POA: Insufficient documentation

## 2013-09-16 DIAGNOSIS — E039 Hypothyroidism, unspecified: Secondary | ICD-10-CM | POA: Insufficient documentation

## 2013-09-16 DIAGNOSIS — M109 Gout, unspecified: Secondary | ICD-10-CM | POA: Insufficient documentation

## 2013-09-16 DIAGNOSIS — D869 Sarcoidosis, unspecified: Secondary | ICD-10-CM | POA: Insufficient documentation

## 2013-09-16 HISTORY — DX: Chronic kidney disease, unspecified: N18.9

## 2013-09-16 SURGERY — LITHOTRIPSY, ESWL
Anesthesia: LOCAL | Laterality: Right

## 2013-09-16 MED ORDER — DIAZEPAM 5 MG PO TABS
10.0000 mg | ORAL_TABLET | ORAL | Status: AC
  Administered 2013-09-16: 10 mg via ORAL
  Filled 2013-09-16: qty 2

## 2013-09-16 MED ORDER — CIPROFLOXACIN HCL 500 MG PO TABS
500.0000 mg | ORAL_TABLET | ORAL | Status: AC
  Administered 2013-09-16: 500 mg via ORAL
  Filled 2013-09-16: qty 1

## 2013-09-16 MED ORDER — DIPHENHYDRAMINE HCL 25 MG PO CAPS
25.0000 mg | ORAL_CAPSULE | ORAL | Status: AC
Start: 2013-09-16 — End: 2013-09-16
  Administered 2013-09-16: 25 mg via ORAL
  Filled 2013-09-16: qty 1

## 2013-09-16 MED ORDER — DEXTROSE-NACL 5-0.45 % IV SOLN
INTRAVENOUS | Status: DC
  Administered 2013-09-16: 12:00:00 via INTRAVENOUS

## 2013-09-16 NOTE — H&P (Signed)
History of Present Illness [name] returns for follow-up. She still complains of nausea and moderate right sided abdominal pain. CT scan shows that the right distal ureteral calculi are no longer present. She still has a 10 mm right renal calculus. I discussed the treatment options with her and her husband: ESL, versus PCNL or ureteroscopy. The risks, benefits of each option were reviewed. SHe would like to have ESL. She previously had ESL for kidney stones. PTH and Uric acid are within normal limits. Blood glucose is elevated at 127 mgm.   Past Medical History Problems  1. History of Anxiety (300.00) 2. History of Asthma (493.90) 3. History of Helicobacter Pylori (H. Pylori) Infection (041.86) 4. History of breast cancer (V10.3) 5. History of esophageal reflux (V12.79) 6. History of gout (V12.29) 7. History of heartburn (V12.79) 8. History of hypertension (V12.59) 9. History of hypothyroidism (V12.29) 10. History of Sarcoidosis (135)  Surgical History Problems  1. History of Hemorrhoidectomy 2. History of Hysterectomy 3. History of Inguinal Hernia Repair 4. History of Lithotripsy 5. History of Tonsillectomy 6. History of Tubal Ligation 7. History of Umbilical Hernia Repair  Current Meds 1. AmLODIPine Besylate 5 MG Oral Tablet;  Therapy: (Recorded:09Mar2015) to Recorded 2. Biotin CAPS;  Therapy: (Recorded:09Mar2015) to Recorded 3. Citracal TABS;  Therapy: (Recorded:09Mar2015) to Recorded 4. Cyclobenzaprine HCl - 10 MG Oral Tablet;  Therapy: (Recorded:09Mar2015) to Recorded 5. Meloxicam 7.5 MG Oral Tablet;  Therapy: (Recorded:09Mar2015) to Recorded 6. Multi-Vitamin TABS;  Therapy: (Recorded:09Mar2015) to Recorded 7. Synthroid 25 MCG Oral Tablet (Levothyroxine Sodium);  Therapy: (Recorded:09Mar2015) to Recorded 8. Tamsulosin HCl - 0.4 MG Oral Capsule; TAKE 1 CAPSULE Bedtime;  Therapy: [id number] to (Last Rx:09Mar2015)  Requested for: [id number] Ordered 9. Vitamin C  TABS;  Therapy: (Recorded:09Mar2015) to Recorded  Allergies Medication  1. Lisinopril TABS 2. Iodine SOLN Non-Medication  3. Contrast Dye  Family History Problems  1. Family history of 4th Sister Deceased At Age 52   stroke 2. Family history of 65th Sister Deceased At Age 73   breast cancer & liver problems 3. Family history of Asthma (V17.5) : Father 4. Family history of Breast Cancer (V16.3) : Sister 5. Family history of Congestive Heart Failure : Mother 6. Family history of Diabetes Mellitus (V18.0) : Sister 84. Family history of Family Health Status Number Of Children   4 sons 60. Family history of asthma (V17.5) : Father 30. Family history of heart failure (V17.49) : Mother 25. Family history of hypertension (V17.49) : Brother 96. Family history of Father Deceased At Age ____   asthma 63. Family history of Heart Disease (V17.49) : Sister 46. Family history of Hypertension (V17.49) : Mother 37. Family history of Hypertension (V17.49) : Sister 49. Family history of Hypertension (V17.49) : Sister 65. Family history of Mother Deceased At Age ____   HTN, CHF 81. Family history of Stroke Syndrome (V17.1) : Sister  Social History Problems  1. Denied: History of Alcohol use 2. Denied: History of Alcohol Use 3. Caffeine use (V49.89)   1 cup 4. Caffeine Use   1 per day 5. Engaged to be married 6. Marital History - Single 7. Never a smoker 8. Never A Smoker 9. Retired  Review of Systems Genitourinary, constitutional, skin, eye, otolaryngeal, hematologic/lymphatic, cardiovascular, pulmonary, endocrine, musculoskeletal, gastrointestinal, neurological and psychiatric system(s) were reviewed and pertinent findings if present are noted.  Gastrointestinal: nausea and abdominal pain.    Physical Exam Constitutional: Well nourished and well developed . No acute distress.  ENT:. The ears  and nose are normal in appearance.  Neck: The appearance of the neck is normal and no  neck mass is present.  Pulmonary: No respiratory distress and normal respiratory rhythm and effort.  Cardiovascular: Heart rate and rhythm are normal . No peripheral edema.  Abdomen: The abdomen is soft and nontender. No masses are palpated. No CVA tenderness. No hernias are palpable. No hepatosplenomegaly noted.  Lymphatics: The femoral and inguinal nodes are not enlarged or tender.  Skin: Normal skin turgor, no visible rash and no visible skin lesions.  Neuro/Psych:. Mood and affect are appropriate.    Results/Data Urine [Data Includes: Last 1 Day]   [id number]  COLOR YELLOW   APPEARANCE CLEAR   SPECIFIC GRAVITY 1.025   pH 5.5   GLUCOSE NEG mg/dL  BILIRUBIN NEG   KETONE NEG mg/dL  BLOOD NEG   PROTEIN NEG mg/dL  UROBILINOGEN 0.2 mg/dL  NITRITE NEG   LEUKOCYTE ESTERASE NEG    I independently reviewed the pelvic CT and the findings are as noted above.   Assessment Assessed  1. Nephrolithiasis (592.0)  Plan ESL right kidney stone. The risks of ESL include but are not limited to hemorrhage, renal or perirenal hematoma, injury to adjacent organs, inability to fragment the stone, steinstrasse. They understand and are agreeable. They also understand that if the stone is not well seen on fluoroscopy we will cancel the procedure.

## 2013-09-16 NOTE — Discharge Instructions (Signed)
Lithotripsy, Care After °Refer to this sheet in the next few weeks. These instructions provide you with information on caring for yourself after your procedure. Your health care provider may also give you more specific instructions. Your treatment has been planned according to current medical practices, but problems sometimes occur. Call your health care provider if you have any problems or questions after your procedure. °WHAT TO EXPECT AFTER THE PROCEDURE  °· Your urine may have a red tinge for a few days after treatment. Blood loss is usually minimal. °· You may have soreness in the back or flank area. This usually goes away after a few days. The procedure can cause blotches or bruises on the back where the pressure wave enters the skin. These marks usually cause only minimal discomfort and should disappear in a short time. °· Stone fragments should begin to pass within 24 hours of treatment. However, a delayed passage is not unusual. °· You may have pain, discomfort, and feel sick to your stomach (nauseated) when the crushed fragments of stone are passed down the tube from the kidney to the bladder. Stone fragments can pass soon after the procedure and may last for up to 4 8 weeks. °· A small number of patients may have severe pain when stone fragments are not able to pass, which leads to an obstruction. °· If your stone is greater than 1 inch (2.5 cm) in diameter or if you have multiple stones that have a combined diameter greater than 1 inch (2.5 cm), you may require more than one treatment. °· If you had a stent placed prior to your procedure, you may experience some discomfort, especially during urination. You may experience the pain or discomfort in your flank or back, or you may experience a sharp pain or discomfort at the base of your penis or in your lower abdomen. The discomfort usually lasts only a few minutes after urinating. °HOME CARE INSTRUCTIONS  °· Rest at home until you feel your energy  improving. °· Only take over-the-counter or prescription medicines for pain, discomfort, or fever as directed by your health care provider. Depending on the type of lithotripsy, you may need to take antibiotics and anti-inflammatory medicines for a few days. °· Drink enough water and fluids to keep your urine clear or pale yellow. This helps "flush" your kidneys. It helps pass any remaining pieces of stone and prevents stones from coming back. °· Most people can resume daily activities within 1 2 days after standard lithotripsy. It can take longer to recover from laser and percutaneous lithotripsy. °· If the stones are in your urinary system, you may be asked to strain your urine at home to look for stones. Any stones that are found can be sent to a medical lab for examination. °· Visit your health care provider for a follow-up appointment in a few weeks. Your doctor may remove your stent if you have one. Your health care provider will also check to see whether stone particles still remain. °SEEK MEDICAL CARE IF:  °· Your pain is not relieved by medicine. °· You have a lasting nauseous feeling. °· You feel there is too much blood in the urine. °· You develop persistent problems with frequent or painful urination that does not at least partially improve after 2 days following the procedure. °· You have a congested cough. °· You feel lightheaded. °· You develop a rash or any other signs that might suggest an allergic problem. °· You develop any reaction or side   effects to your medicine(s). °SEEK IMMEDIATE MEDICAL CARE IF:  °· You experience severe back or flank pain or both. °· You see nothing but blood when you urinate. °· You cannot pass any urine at all. °· You have a fever or shaking chills. °· You develop shortness of breath, difficulty breathing, or chest pain. °· You develop vomiting that will not stop after 6 8 hours. °· You have a fainting episode. °Document Released: 06/02/2007 Document Revised: 03/03/2013  Document Reviewed: 11/26/2012 °[employer]® Patient Information ©2014 [employer]. ° °

## 2013-09-16 NOTE — Op Note (Signed)
Refer to [facility] Op Note scanned in the chart 

## 2014-02-02 ENCOUNTER — Other Ambulatory Visit: Payer: Self-pay | Admitting: Gastroenterology

## 2014-02-15 ENCOUNTER — Encounter: Payer: BC Managed Care – PPO | Attending: Family Medicine | Admitting: Dietician

## 2014-02-15 ENCOUNTER — Encounter: Payer: Self-pay | Admitting: Dietician

## 2014-02-15 VITALS — Ht 63.0 in | Wt 192.7 lb

## 2014-02-15 DIAGNOSIS — Z713 Dietary counseling and surveillance: Secondary | ICD-10-CM | POA: Insufficient documentation

## 2014-02-15 DIAGNOSIS — R7309 Other abnormal glucose: Secondary | ICD-10-CM | POA: Insufficient documentation

## 2014-02-15 NOTE — Patient Instructions (Addendum)
-  Have a protein food with every meal and snack -Consider adding a carbohydrate food to breakfast -Continue to exercise regularly -Fill up on non-starchy vegetables (any veggie that's not corn, peas, or potatoes)  -Veggies are good raw or cooked, fresh or frozen -Limit chicken wings to 1x a week -Continue to watch portions and be mindful of the serving size on the food label -Continue to have a steady intake of carbs throughout the day

## 2014-02-15 NOTE — Progress Notes (Signed)
  Medical Nutrition Therapy:  Appt start time: 200 end time:  300.   Assessment:  Primary concerns today: Hannah Nicholson is here today with her husband to discuss her high blood sugars. She states that she has "light diabetes." However, there is not a HgbA1c available and the patient is unsure what it was. Since being told of her high blood glucose, she reports she has been working out for longer periods of time. Hannah Nicholson lives with her husband and he usually does the grocery shopping. She reports that she has not been sleeping well due to menopause. Gets up at 5 or 5:30am and may nap from 7 to 8 am. Started work out regimen in January and wasn't losing weight until recently when MD increased Synthroid about a month ago and has since lost 7 pounds.  Preferred Learning Style:  No preference indicated   Learning Readiness:   Ready  MEDICATIONS: see list   DIETARY INTAKE:  Usual eating pattern includes 3 meals and 2-3 snacks per day.  Avoided foods include cabbage, bread, pizza, milk.    24-hr recall:  B (8-9 AM): egg with 2-3 slices of [place] bacon  Snk ( AM): peanut butter crackers  L ( PM): 6-inch tuna sub OR Naked green machine smoothie  Snk ( PM): nuts, peanut butter crackers, or chips D ( PM): baked chicken or pork chops or fish, 2 vegetables, starch  Snk ( PM): peanut butter crackers, nuts, or Cheezits  Beverages: water, green tea, coffee with half and half and agave  Usual physical activity: Treadmill, elliptical, and weights for 45 minutes (has been inconsistent recently)  Estimated energy needs: 1500-1600 calories 170-180 g carbohydrates  Progress Towards Goal(s):  In progress.   Nutritional Diagnosis:  [place]-2.2 Altered nutrition-related laboratory As related to obesity and history of excessive energy intake.  As evidenced by BMI 34 and diagnosis of prediabetes.    Intervention:  Nutrition counseling provided. Encouraged continued weight loss and regular exercise for management  of blood glucose. Discussed importance of appropriate portion sizes and high fiber carbohydrate choices.    Goals: -Have a protein food with every meal and snack -Consider adding a carbohydrate food to breakfast -Continue to exercise regularly -Fill up on non-starchy vegetables (any veggie that's not corn, peas, or potatoes)  -Veggies are good raw or cooked, fresh or frozen -Limit chicken wings to 1x a week -Continue to watch portions and be mindful of the serving size on the food label -Continue to have a steady intake of carbs throughout the day  Teaching Method Utilized:  Visual Auditory Hands on  Handouts given during visit include:  Low sodium flavoring tips  Carbohydrate portion sizes  MyPlate  54S CHO + protein snacks  Barriers to learning/adherence to lifestyle change: none  Demonstrated degree of understanding via:  Teach Back   Monitoring/Evaluation:  Dietary intake, exercise, labs, and body weight prn.

## 2014-05-27 HISTORY — PX: MASTECTOMY: SHX3

## 2014-09-19 ENCOUNTER — Other Ambulatory Visit: Payer: Self-pay | Admitting: Endocrinology

## 2014-09-19 DIAGNOSIS — E049 Nontoxic goiter, unspecified: Secondary | ICD-10-CM

## 2014-09-22 ENCOUNTER — Ambulatory Visit
Admission: RE | Admit: 2014-09-22 | Discharge: 2014-09-22 | Disposition: A | Discharge status: Home / Self Care | Payer: BLUE CROSS/BLUE SHIELD | Source: Ambulatory Visit | Attending: Endocrinology | Admitting: Endocrinology

## 2014-09-22 DIAGNOSIS — E049 Nontoxic goiter, unspecified: Secondary | ICD-10-CM

## 2015-04-04 DIAGNOSIS — N6092 Unspecified benign mammary dysplasia of left breast: Secondary | ICD-10-CM | POA: Insufficient documentation

## 2015-04-05 DIAGNOSIS — Z86000 Personal history of in-situ neoplasm of breast: Secondary | ICD-10-CM | POA: Insufficient documentation

## 2015-05-28 HISTORY — PX: BREAST SURGERY: SHX581

## 2015-08-31 ENCOUNTER — Other Ambulatory Visit: Payer: Self-pay | Admitting: Endocrinology

## 2015-08-31 DIAGNOSIS — E049 Nontoxic goiter, unspecified: Secondary | ICD-10-CM

## 2015-09-06 ENCOUNTER — Ambulatory Visit
Admission: RE | Admit: 2015-09-06 | Discharge: 2015-09-06 | Disposition: A | Discharge status: Home / Self Care | Payer: BLUE CROSS/BLUE SHIELD | Source: Ambulatory Visit | Attending: Endocrinology | Admitting: Endocrinology

## 2015-09-06 DIAGNOSIS — E049 Nontoxic goiter, unspecified: Secondary | ICD-10-CM

## 2016-08-13 ENCOUNTER — Other Ambulatory Visit: Payer: Self-pay | Admitting: Endocrinology

## 2016-08-13 DIAGNOSIS — E049 Nontoxic goiter, unspecified: Secondary | ICD-10-CM

## 2016-09-04 ENCOUNTER — Ambulatory Visit
Admission: RE | Admit: 2016-09-04 | Discharge: 2016-09-04 | Disposition: A | Discharge status: Home / Self Care | Payer: BLUE CROSS/BLUE SHIELD | Source: Ambulatory Visit | Attending: Endocrinology | Admitting: Endocrinology

## 2016-09-04 DIAGNOSIS — E049 Nontoxic goiter, unspecified: Secondary | ICD-10-CM

## 2017-04-07 ENCOUNTER — Other Ambulatory Visit: Payer: Self-pay | Admitting: Family Medicine

## 2017-08-18 DIAGNOSIS — E559 Vitamin D deficiency, unspecified: Secondary | ICD-10-CM | POA: Diagnosis not present

## 2017-08-18 DIAGNOSIS — I10 Essential (primary) hypertension: Secondary | ICD-10-CM | POA: Diagnosis not present

## 2017-08-18 DIAGNOSIS — M109 Gout, unspecified: Secondary | ICD-10-CM | POA: Diagnosis not present

## 2017-08-18 DIAGNOSIS — E7800 Pure hypercholesterolemia, unspecified: Secondary | ICD-10-CM | POA: Diagnosis not present

## 2017-08-18 DIAGNOSIS — G479 Sleep disorder, unspecified: Secondary | ICD-10-CM | POA: Diagnosis not present

## 2017-08-18 DIAGNOSIS — Z0000 Encounter for general adult medical examination without abnormal findings: Secondary | ICD-10-CM | POA: Diagnosis not present

## 2017-08-22 DIAGNOSIS — E049 Nontoxic goiter, unspecified: Secondary | ICD-10-CM | POA: Diagnosis not present

## 2017-09-11 DIAGNOSIS — E559 Vitamin D deficiency, unspecified: Secondary | ICD-10-CM | POA: Diagnosis not present

## 2017-09-11 DIAGNOSIS — M109 Gout, unspecified: Secondary | ICD-10-CM | POA: Diagnosis not present

## 2017-09-11 DIAGNOSIS — E7800 Pure hypercholesterolemia, unspecified: Secondary | ICD-10-CM | POA: Diagnosis not present

## 2017-09-11 DIAGNOSIS — I10 Essential (primary) hypertension: Secondary | ICD-10-CM | POA: Diagnosis not present

## 2017-09-11 DIAGNOSIS — R69 Illness, unspecified: Secondary | ICD-10-CM | POA: Diagnosis not present

## 2017-10-13 DIAGNOSIS — E049 Nontoxic goiter, unspecified: Secondary | ICD-10-CM | POA: Diagnosis not present

## 2017-10-13 DIAGNOSIS — E039 Hypothyroidism, unspecified: Secondary | ICD-10-CM | POA: Diagnosis not present

## 2017-10-13 DIAGNOSIS — I10 Essential (primary) hypertension: Secondary | ICD-10-CM | POA: Diagnosis not present

## 2017-10-13 DIAGNOSIS — E7800 Pure hypercholesterolemia, unspecified: Secondary | ICD-10-CM | POA: Diagnosis not present

## 2017-10-13 DIAGNOSIS — E119 Type 2 diabetes mellitus without complications: Secondary | ICD-10-CM | POA: Diagnosis not present

## 2017-10-15 ENCOUNTER — Other Ambulatory Visit: Payer: Self-pay | Admitting: Endocrinology

## 2017-10-15 DIAGNOSIS — E049 Nontoxic goiter, unspecified: Secondary | ICD-10-CM

## 2017-10-22 ENCOUNTER — Ambulatory Visit
Admission: RE | Admit: 2017-10-22 | Discharge: 2017-10-22 | Disposition: A | Discharge status: Home / Self Care | Payer: Medicare HMO | Source: Ambulatory Visit | Attending: Endocrinology | Admitting: Endocrinology

## 2017-10-22 DIAGNOSIS — E042 Nontoxic multinodular goiter: Secondary | ICD-10-CM | POA: Diagnosis not present

## 2017-10-22 DIAGNOSIS — E049 Nontoxic goiter, unspecified: Secondary | ICD-10-CM

## 2017-10-30 DIAGNOSIS — Z853 Personal history of malignant neoplasm of breast: Secondary | ICD-10-CM | POA: Diagnosis not present

## 2017-10-30 DIAGNOSIS — Z9012 Acquired absence of left breast and nipple: Secondary | ICD-10-CM | POA: Diagnosis not present

## 2017-10-30 DIAGNOSIS — C50912 Malignant neoplasm of unspecified site of left female breast: Secondary | ICD-10-CM | POA: Diagnosis not present

## 2017-10-30 DIAGNOSIS — N6082 Other benign mammary dysplasias of left breast: Secondary | ICD-10-CM | POA: Diagnosis not present

## 2017-10-30 DIAGNOSIS — Z48817 Encounter for surgical aftercare following surgery on the skin and subcutaneous tissue: Secondary | ICD-10-CM | POA: Diagnosis not present

## 2017-10-30 DIAGNOSIS — N6459 Other signs and symptoms in breast: Secondary | ICD-10-CM | POA: Diagnosis not present

## 2017-10-30 DIAGNOSIS — Z08 Encounter for follow-up examination after completed treatment for malignant neoplasm: Secondary | ICD-10-CM | POA: Diagnosis not present

## 2017-10-30 DIAGNOSIS — Z86000 Personal history of in-situ neoplasm of breast: Secondary | ICD-10-CM | POA: Diagnosis not present

## 2017-12-11 DIAGNOSIS — H52223 Regular astigmatism, bilateral: Secondary | ICD-10-CM | POA: Diagnosis not present

## 2017-12-11 DIAGNOSIS — H5203 Hypermetropia, bilateral: Secondary | ICD-10-CM | POA: Diagnosis not present

## 2017-12-11 DIAGNOSIS — H40013 Open angle with borderline findings, low risk, bilateral: Secondary | ICD-10-CM | POA: Diagnosis not present

## 2017-12-11 DIAGNOSIS — E119 Type 2 diabetes mellitus without complications: Secondary | ICD-10-CM | POA: Diagnosis not present

## 2017-12-11 DIAGNOSIS — H40053 Ocular hypertension, bilateral: Secondary | ICD-10-CM | POA: Diagnosis not present

## 2017-12-11 DIAGNOSIS — H524 Presbyopia: Secondary | ICD-10-CM | POA: Diagnosis not present

## 2017-12-11 DIAGNOSIS — Z7984 Long term (current) use of oral hypoglycemic drugs: Secondary | ICD-10-CM | POA: Diagnosis not present

## 2018-01-08 DIAGNOSIS — H40013 Open angle with borderline findings, low risk, bilateral: Secondary | ICD-10-CM | POA: Diagnosis not present

## 2018-01-08 DIAGNOSIS — H40053 Ocular hypertension, bilateral: Secondary | ICD-10-CM | POA: Diagnosis not present

## 2018-01-22 DIAGNOSIS — Z923 Personal history of irradiation: Secondary | ICD-10-CM | POA: Diagnosis not present

## 2018-01-22 DIAGNOSIS — Z171 Estrogen receptor negative status [ER-]: Secondary | ICD-10-CM | POA: Diagnosis not present

## 2018-01-22 DIAGNOSIS — Z1231 Encounter for screening mammogram for malignant neoplasm of breast: Secondary | ICD-10-CM | POA: Diagnosis not present

## 2018-01-22 DIAGNOSIS — Z9221 Personal history of antineoplastic chemotherapy: Secondary | ICD-10-CM | POA: Diagnosis not present

## 2018-01-22 DIAGNOSIS — E119 Type 2 diabetes mellitus without complications: Secondary | ICD-10-CM | POA: Diagnosis not present

## 2018-01-22 DIAGNOSIS — Z7984 Long term (current) use of oral hypoglycemic drugs: Secondary | ICD-10-CM | POA: Diagnosis not present

## 2018-01-22 DIAGNOSIS — Z1509 Genetic susceptibility to other malignant neoplasm: Secondary | ICD-10-CM | POA: Diagnosis not present

## 2018-01-22 DIAGNOSIS — C50912 Malignant neoplasm of unspecified site of left female breast: Secondary | ICD-10-CM | POA: Diagnosis not present

## 2018-01-27 DIAGNOSIS — M25559 Pain in unspecified hip: Secondary | ICD-10-CM | POA: Diagnosis not present

## 2018-01-27 DIAGNOSIS — R531 Weakness: Secondary | ICD-10-CM | POA: Diagnosis not present

## 2018-02-17 DIAGNOSIS — C50912 Malignant neoplasm of unspecified site of left female breast: Secondary | ICD-10-CM | POA: Diagnosis not present

## 2018-02-26 DIAGNOSIS — R002 Palpitations: Secondary | ICD-10-CM | POA: Diagnosis not present

## 2018-02-26 DIAGNOSIS — I10 Essential (primary) hypertension: Secondary | ICD-10-CM | POA: Diagnosis not present

## 2018-02-26 DIAGNOSIS — Z23 Encounter for immunization: Secondary | ICD-10-CM | POA: Diagnosis not present

## 2018-03-26 ENCOUNTER — Encounter: Payer: Self-pay | Admitting: Interventional Cardiology

## 2018-04-14 DIAGNOSIS — E119 Type 2 diabetes mellitus without complications: Secondary | ICD-10-CM | POA: Diagnosis not present

## 2018-04-14 DIAGNOSIS — E039 Hypothyroidism, unspecified: Secondary | ICD-10-CM | POA: Diagnosis not present

## 2018-04-14 DIAGNOSIS — E7800 Pure hypercholesterolemia, unspecified: Secondary | ICD-10-CM | POA: Diagnosis not present

## 2018-04-17 DIAGNOSIS — I10 Essential (primary) hypertension: Secondary | ICD-10-CM | POA: Diagnosis not present

## 2018-04-17 DIAGNOSIS — R55 Syncope and collapse: Secondary | ICD-10-CM | POA: Diagnosis not present

## 2018-04-17 DIAGNOSIS — E039 Hypothyroidism, unspecified: Secondary | ICD-10-CM | POA: Diagnosis not present

## 2018-04-17 DIAGNOSIS — E7800 Pure hypercholesterolemia, unspecified: Secondary | ICD-10-CM | POA: Diagnosis not present

## 2018-04-17 DIAGNOSIS — E049 Nontoxic goiter, unspecified: Secondary | ICD-10-CM | POA: Diagnosis not present

## 2018-04-17 DIAGNOSIS — E119 Type 2 diabetes mellitus without complications: Secondary | ICD-10-CM | POA: Diagnosis not present

## 2018-04-19 NOTE — Progress Notes (Signed)
Cardiology Office Note:    Date:  04/20/2018   ID:  Hannah Nicholson, DOB 1951-04-16, MRN [id number]  PCP:  Lawerance Cruel, MD  Cardiologist:  No primary care provider on file.   Referring MD: Lawerance Cruel, MD   Chief Complaint  Patient presents with  . Near Syncope    History of Present Illness:    Hannah Nicholson is a 67 y.o. female with a hx of ductal breast carcinoma, asthma, ? sarcoidosis who is referred by Dr. Myriam Jacobson for consultation concerning near syncope.  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  No history of other organ involvement.  Diagnosis was made in [place] at [facility].  She has history of recurrent breast cancer and is undergoing chemotherapy at cancer treatment centers of [place].  She does not know what chemotherapeutic agents were used most recently.  She will provide this information for [place].  She has no primary history of vascular disease but is referred by Dr. Harle Battiest for consultation due to an episode of near syncope.  She exercises regularly.  The incident occurred after a heavier than usual workout.  Approximately 1 hour after the workout, which involved both isometric and isotonic activity, while sitting on her couch she felt very weak as if she was going to faint.  There was no associated other complaints.  She specifically denied palpitations, dyspnea, nausea, diaphoresis, and chest pain.  The episode lasted less than 60 seconds.  There is no history of fainting.  She has done well since that time and has returned to activity including her exercise routine at the gym.  There is no family history of significant heart disease.  Past Medical History:  Diagnosis Date  . Allergic rhinitis, cause unspecified   . Anxiety   . Chronic kidney disease    kidney stones  . Diabetes ([place])   . Diverticulitis   . Esophageal reflux   . Gastroesophageal reflux disease without esophagitis   . Gout   .  History of colonic polyps   . HTN (hypertension)   . Hypothyroidism   . Low back pain   . Nontoxic single thyroid nodule   . Personal history of malignant neoplasm of breast   . Pure hypercholesterolemia, unspecified   . Sarcoidosis   . Sleep disturbance   . Stress reaction   . Unspecified asthma(493.90)   . Vaginal atrophy   . Vitamin D deficiency     Past Surgical History:  Procedure Laterality Date  . BREAST LUMPECTOMY Left    BREAST CANCER AND RADIATION, NO CHEMO  . CHOLECYSTECTOMY    . COLONOSCOPY     TUBULAR ADENOMA  . HEMORRHOID SURGERY    . LAPAROSCOPIC INGUINAL HERNIA WITH UMBILICAL HERNIA    . MASTECTOMY Left   . sinus surgery    . TONSILLECTOMY    . TOTAL ABDOMINAL HYSTERECTOMY    . TUBAL LIGATION      Current Medications: Current Meds  Medication Sig  . Cholecalciferol (VITAMIN D-3) 5000 UNITS TABS Take 1 tablet by mouth daily.  . fexofenadine (ALLEGRA) 180 MG tablet Take 180 mg by mouth daily.    . hydrochlorothiazide (HYDRODIURIL) 25 MG tablet Take 1 tablet by mouth daily.  Marland Kitchen HYDROcodone-acetaminophen (NORCO/VICODIN) 5-325 MG per tablet Take 1 tablet by mouth every 6 (six) hours as needed for moderate pain.  Marland Kitchen ibuprofen (ADVIL,MOTRIN) 600 MG tablet Take 1 tablet by mouth as needed.  Marland Kitchen  levothyroxine (SYNTHROID, LEVOTHROID) 25 MCG tablet Take 25 mcg by mouth daily before breakfast.  . metoprolol succinate (TOPROL-XL) 25 MG 24 hr tablet Take 1 tablet by mouth daily.  . Multiple Vitamin (MULTIVITAMIN) capsule Take 1 capsule by mouth daily.    Marland Kitchen telmisartan (MICARDIS) 80 MG tablet Take 1 tablet by mouth daily.     Allergies:   Iodine; Ivp dye [iodinated diagnostic agents]; Amlodipine besylate; Hydrochlorothiazide; and Lisinopril   Social History   Socioeconomic History  . Marital status: Single    Spouse name: Not on file  . Number of children: Not on file  . Years of education: Not on file  . Highest education level: Not on file  Occupational History    . Occupation: Web designer: [employer]  Social Needs  . Financial resource strain: Not on file  . Food insecurity:    Worry: Not on file    Inability: Not on file  . Transportation needs:    Medical: Not on file    Non-medical: Not on file  Tobacco Use  . Smoking status: Never Smoker  . Smokeless tobacco: Never Used  Substance and Sexual Activity  . Alcohol use: No  . Drug use: No  . Sexual activity: Not on file  Lifestyle  . Physical activity:    Days per week: Not on file    Minutes per session: Not on file  . Stress: Not on file  Relationships  . Social connections:    Talks on phone: Not on file    Gets together: Not on file    Attends religious service: Not on file    Active member of club or organization: Not on file    Attends meetings of clubs or organizations: Not on file    Relationship status: Not on file  Other Topics Concern  . Not on file  Social History Narrative   Significant Other- Ed     Family History: The patient's family history includes Asthma in her father; Breast cancer (age of onset: 78) in her sister; Heart disease in her mother; Lung cancer in her cousin; Lupus in her sister; Prostate cancer in her maternal uncle and maternal uncle; Prostate cancer (age of onset: 65) in her brother; Stroke (age of onset: 82) in her sister.  ROS:   Please see the history of present illness.    Prior Bell's palsy.  No recurrence of neurological symptoms diagnosis of sarcoidosis being made at that time.  Breast cancer with relapse 3 years ago requiring chemotherapy.  She has had a complete mastectomy and denies radiation therapy.  All other systems reviewed and are negative.  EKGs/Labs/Other Studies Reviewed:    The following studies were reviewed today: No current cardiac imaging or functional data available.  EKG:  EKG is  ordered today.  The ekg ordered today demonstrates sinus bradycardia 54 bpm with nonspecific T wave flattening.  No  prior tracings to compare  Recent Labs: No results found for requested labs within last 8760 hours.  Recent Lipid Panel    Component Value Date/Time   CHOL (H) 03/06/2010 0653    224        ATP III CLASSIFICATION:  <200     mg/dL   Desirable  200-239  mg/dL   Borderline High  >=240    mg/dL   High          TRIG 55 03/06/2010 0653   HDL 59 03/06/2010 0653   CHOLHDL  3.8 03/06/2010 0653   VLDL 11 03/06/2010 0653   LDLCALC (H) 03/06/2010 0653    154        Total Cholesterol/HDL:CHD Risk Coronary Heart Disease Risk Table                     Men   Women  1/2 Average Risk   3.4   3.3  Average Risk       5.0   4.4  2 X Average Risk   9.6   7.1  3 X Average Risk  23.4   11.0        Use the calculated Patient Ratio above and the CHD Risk Table to determine the patient's CHD Risk.        ATP III CLASSIFICATION (LDL):  <100     mg/dL   Optimal  100-129  mg/dL   Near or Above                    Optimal  130-159  mg/dL   Borderline  160-189  mg/dL   High  >190     mg/dL   Very High    Physical Exam:    VS:  BP (!) 162/88   Pulse (!) 54   Ht 5\' 3"  (1.6 m)   Wt 183 lb 12.8 oz (83.4 kg)   SpO2 99%   BMI 32.56 kg/m     Wt Readings from Last 3 Encounters:  04/20/18 183 lb 12.8 oz (83.4 kg)  02/15/14 192 lb 11.2 oz (87.4 kg)  09/09/13 192 lb (87.1 kg)     GEN:  Well nourished, well developed in no acute distress HEENT: Normal NECK: No JVD. LYMPHATICS: No lymphadenopathy CARDIAC: RRR, no murmur, no gallop, no edema. VASCULAR: 2+ bilateral pulses.  No bruits. RESPIRATORY:  Clear to auscultation without rales, wheezing or rhonchi  ABDOMEN: Soft, non-tender, non-distended, No pulsatile mass, MUSCULOSKELETAL: No deformity  SKIN: Warm and dry NEUROLOGIC:  Alert and oriented x 3 PSYCHIATRIC:  Normal affect   ASSESSMENT:    1. Near syncope   2. Palpitations   3. Sarcoidosis    PLAN:    In order of problems listed above:  1. 2D Doppler echocardiogram and stress  Myoview to evaluate an episode of near syncope that occurred at least an hour post vigorous exercise.  She was sitting when the episode occurred and there was no prodrome.  She specifically denies palpitations.  The whole episode lasted less than 60 seconds.  She mentioned this to her primary physician is now here for evaluation.  LV size and function as well as myocardial perfusion will be evaluated.  Other issues that could place her at increased risk for arrhythmia or prior history of neurologic sarcoid and prior chemotherapy for breast cancer recurrence in 2017.  We need to identify the chemotherapeutic agents.  She was treated at cancer [facility] of [place] in [place].  May need continuous monitoring.  If any abnormalities noted on echo may need to consider cardiac MRI given history of sarcoid.   Medication Adjustments/Labs and Tests Ordered: Current medicines are reviewed at length with the patient today.  Concerns regarding medicines are outlined above.  Orders Placed This Encounter  Procedures  . MYOCARDIAL PERFUSION IMAGING  . EKG 12-Lead  . ECHOCARDIOGRAM COMPLETE   No orders of the defined types were placed in this encounter.   Patient Instructions  Medication Instructions:  Your physician recommends that you continue on your  current medications as directed. Please refer to the Current Medication list given to you today.  If you need a refill on your cardiac medications before your next appointment, please call your pharmacy.   Lab work: None ordered If you have labs (blood work) drawn today and your tests are completely normal, you will receive your results only by: Marland Kitchen MyChart Message (if you have MyChart) OR . A paper copy in the mail If you have any lab test that is abnormal or we need to change your treatment, we will call you to review the results.  Testing/Procedures: Your physician has requested that you have an echocardiogram. Echocardiography is a painless test  that uses sound waves to create images of your heart. It provides your doctor with information about the size and shape of your heart and how well your heart's chambers and valves are working. This procedure takes approximately one hour. There are no restrictions for this procedure.  Your physician has requested that you have en exercise stress myoview. For further information please visit [email or web address]. Please follow instruction sheet, as given.  Follow-Up: Based on test results  Any Other Special Instructions Will Be Listed Below (If Applicable). Cardiac Nuclear Scan A cardiac nuclear scan is a test that measures blood flow to the heart when a person is resting and when he or she is exercising. The test looks for problems such as:  Not enough blood reaching a portion of the heart.  The heart muscle not working normally.  You may need this test if:  You have heart disease.  You have had abnormal lab results.  You have had heart surgery or angioplasty.  You have chest pain.  You have shortness of breath.  In this test, a radioactive dye (tracer) is injected into your bloodstream. After the tracer has traveled to your heart, an imaging device is used to measure how much of the tracer is absorbed by or distributed to various areas of your heart. This procedure is usually done at a hospital and takes 2-4 hours. Tell a health care provider about:  Any allergies you have.  All medicines you are taking, including vitamins, herbs, eye drops, creams, and over-the-counter medicines.  Any problems you or family members have had with the use of anesthetic medicines.  Any blood disorders you have.  Any surgeries you have had.  Any medical conditions you have.  Whether you are pregnant or may be pregnant. What are the risks? Generally, this is a safe procedure. However, problems may occur, including:  Serious chest pain and heart attack. This is only a risk if the stress  portion of the test is done.  Rapid heartbeat.  Sensation of warmth in your chest. This usually passes quickly.  What happens before the procedure?  Ask your health care provider about changing or stopping your regular medicines. This is especially important if you are taking diabetes medicines or blood thinners.  Remove your jewelry on the day of the procedure. What happens during the procedure?  An IV tube will be inserted into one of your veins.  Your health care provider will inject a small amount of radioactive tracer through the tube.  You will wait for 20-40 minutes while the tracer travels through your bloodstream.  Your heart activity will be monitored with an electrocardiogram (ECG).  You will lie down on an exam table.  Images of your heart will be taken for about 15-20 minutes.  You may be asked to exercise on a  treadmill or stationary bike. While you exercise, your heart's activity will be monitored with an ECG, and your blood pressure will be checked. If you are unable to exercise, you may be given a medicine to increase blood flow to parts of your heart.  When blood flow to your heart has peaked, a tracer will again be injected through the IV tube.  After 20-40 minutes, you will get back on the exam table and have more images taken of your heart.  When the procedure is over, your IV tube will be removed. The procedure may vary among health care providers and hospitals. Depending on the type of tracer used, scans may need to be repeated 3-4 hours later. What happens after the procedure?  Unless your health care provider tells you otherwise, you may return to your normal schedule, including diet, activities, and medicines.  Unless your health care provider tells you otherwise, you may increase your fluid intake. This will help flush the contrast dye from your body. Drink enough fluid to keep your urine clear or pale yellow.  It is up to you to get your test  results. Ask your health care provider, or the department that is doing the test, when your results will be ready. Summary  A cardiac nuclear scan measures the blood flow to the heart when a person is resting and when he or she is exercising.  You may need this test if you are at risk for heart disease.  Tell your health care provider if you are pregnant.  Unless your health care provider tells you otherwise, increase your fluid intake. This will help flush the contrast dye from your body. Drink enough fluid to keep your urine clear or pale yellow. This information is not intended to replace advice given to you by your health care provider. Make sure you discuss any questions you have with your health care provider. Document Released: 06/07/2004 Document Revised: 05/15/2016 Document Reviewed: 04/21/2013 Elsevier Interactive Patient Education  2017 [place]. Echocardiogram An echocardiogram, or echocardiography, uses sound waves (ultrasound) to produce an image of your heart. The echocardiogram is simple, painless, obtained within a short period of time, and offers valuable information to your health care provider. The images from an echocardiogram can provide information such as:  Evidence of coronary artery disease (CAD).  Heart size.  Heart muscle function.  Heart valve function.  Aneurysm detection.  Evidence of a past heart attack.  Fluid buildup around the heart.  Heart muscle thickening.  Assess heart valve function.  Tell a health care provider about:  Any allergies you have.  All medicines you are taking, including vitamins, herbs, eye drops, creams, and over-the-counter medicines.  Any problems you or family members have had with anesthetic medicines.  Any blood disorders you have.  Any surgeries you have had.  Any medical conditions you have.  Whether you are pregnant or may be pregnant. What happens before the procedure? No special preparation is  needed. Eat and drink normally. What happens during the procedure?  In order to produce an image of your heart, gel will be applied to your chest and a wand-like tool (transducer) will be moved over your chest. The gel will help transmit the sound waves from the transducer. The sound waves will harmlessly bounce off your heart to allow the heart images to be captured in real-time motion. These images will then be recorded.  You may need an IV to receive a medicine that improves the quality of the  pictures. What happens after the procedure? You may return to your normal schedule including diet, activities, and medicines, unless your health care provider tells you otherwise. This information is not intended to replace advice given to you by your health care provider. Make sure you discuss any questions you have with your health care provider. Document Released: 05/10/2000 Document Revised: 12/30/2015 Document Reviewed: 01/18/2013 Elsevier Interactive Patient Education  2017 [facility], Sinclair Grooms, MD  04/20/2018 1:38 PM    [place]

## 2018-04-20 ENCOUNTER — Encounter (INDEPENDENT_AMBULATORY_CARE_PROVIDER_SITE_OTHER): Payer: Self-pay

## 2018-04-20 ENCOUNTER — Encounter: Payer: Self-pay | Admitting: Interventional Cardiology

## 2018-04-20 ENCOUNTER — Ambulatory Visit: Payer: Medicare HMO | Admitting: Interventional Cardiology

## 2018-04-20 VITALS — BP 162/88 | HR 54 | Ht 63.0 in | Wt 183.8 lb

## 2018-04-20 DIAGNOSIS — R55 Syncope and collapse: Secondary | ICD-10-CM | POA: Diagnosis not present

## 2018-04-20 DIAGNOSIS — D869 Sarcoidosis, unspecified: Secondary | ICD-10-CM

## 2018-04-20 DIAGNOSIS — R002 Palpitations: Secondary | ICD-10-CM | POA: Diagnosis not present

## 2018-04-20 NOTE — Patient Instructions (Signed)
Medication Instructions:  Your physician recommends that you continue on your current medications as directed. Please refer to the Current Medication list given to you today.  If you need a refill on your cardiac medications before your next appointment, please call your pharmacy.   Lab work: None ordered If you have labs (blood work) drawn today and your tests are completely normal, you will receive your results only by: Marland Kitchen MyChart Message (if you have MyChart) OR . A paper copy in the mail If you have any lab test that is abnormal or we need to change your treatment, we will call you to review the results.  Testing/Procedures: Your physician has requested that you have an echocardiogram. Echocardiography is a painless test that uses sound waves to create images of your heart. It provides your doctor with information about the size and shape of your heart and how well your heart's chambers and valves are working. This procedure takes approximately one hour. There are no restrictions for this procedure.  Your physician has requested that you have en exercise stress myoview. For further information please visit [email or web address]. Please follow instruction sheet, as given.  Follow-Up: Based on test results  Any Other Special Instructions Will Be Listed Below (If Applicable). Cardiac Nuclear Scan A cardiac nuclear scan is a test that measures blood flow to the heart when a person is resting and when he or she is exercising. The test looks for problems such as:  Not enough blood reaching a portion of the heart.  The heart muscle not working normally.  You may need this test if:  You have heart disease.  You have had abnormal lab results.  You have had heart surgery or angioplasty.  You have chest pain.  You have shortness of breath.  In this test, a radioactive dye (tracer) is injected into your bloodstream. After the tracer has traveled to your heart, an imaging device is used  to measure how much of the tracer is absorbed by or distributed to various areas of your heart. This procedure is usually done at a hospital and takes 2-4 hours. Tell a health care provider about:  Any allergies you have.  All medicines you are taking, including vitamins, herbs, eye drops, creams, and over-the-counter medicines.  Any problems you or family members have had with the use of anesthetic medicines.  Any blood disorders you have.  Any surgeries you have had.  Any medical conditions you have.  Whether you are pregnant or may be pregnant. What are the risks? Generally, this is a safe procedure. However, problems may occur, including:  Serious chest pain and heart attack. This is only a risk if the stress portion of the test is done.  Rapid heartbeat.  Sensation of warmth in your chest. This usually passes quickly.  What happens before the procedure?  Ask your health care provider about changing or stopping your regular medicines. This is especially important if you are taking diabetes medicines or blood thinners.  Remove your jewelry on the day of the procedure. What happens during the procedure?  An IV tube will be inserted into one of your veins.  Your health care provider will inject a small amount of radioactive tracer through the tube.  You will wait for 20-40 minutes while the tracer travels through your bloodstream.  Your heart activity will be monitored with an electrocardiogram (ECG).  You will lie down on an exam table.  Images of your heart will be taken for about 15-20  minutes.  You may be asked to exercise on a treadmill or stationary bike. While you exercise, your heart's activity will be monitored with an ECG, and your blood pressure will be checked. If you are unable to exercise, you may be given a medicine to increase blood flow to parts of your heart.  When blood flow to your heart has peaked, a tracer will again be injected through the IV  tube.  After 20-40 minutes, you will get back on the exam table and have more images taken of your heart.  When the procedure is over, your IV tube will be removed. The procedure may vary among health care providers and hospitals. Depending on the type of tracer used, scans may need to be repeated 3-4 hours later. What happens after the procedure?  Unless your health care provider tells you otherwise, you may return to your normal schedule, including diet, activities, and medicines.  Unless your health care provider tells you otherwise, you may increase your fluid intake. This will help flush the contrast dye from your body. Drink enough fluid to keep your urine clear or pale yellow.  It is up to you to get your test results. Ask your health care provider, or the department that is doing the test, when your results will be ready. Summary  A cardiac nuclear scan measures the blood flow to the heart when a person is resting and when he or she is exercising.  You may need this test if you are at risk for heart disease.  Tell your health care provider if you are pregnant.  Unless your health care provider tells you otherwise, increase your fluid intake. This will help flush the contrast dye from your body. Drink enough fluid to keep your urine clear or pale yellow. This information is not intended to replace advice given to you by your health care provider. Make sure you discuss any questions you have with your health care provider. Document Released: 06/07/2004 Document Revised: 05/15/2016 Document Reviewed: 04/21/2013 Elsevier Interactive Patient Education  2017 [place]. Echocardiogram An echocardiogram, or echocardiography, uses sound waves (ultrasound) to produce an image of your heart. The echocardiogram is simple, painless, obtained within a short period of time, and offers valuable information to your health care provider. The images from an echocardiogram can provide information  such as:  Evidence of coronary artery disease (CAD).  Heart size.  Heart muscle function.  Heart valve function.  Aneurysm detection.  Evidence of a past heart attack.  Fluid buildup around the heart.  Heart muscle thickening.  Assess heart valve function.  Tell a health care provider about:  Any allergies you have.  All medicines you are taking, including vitamins, herbs, eye drops, creams, and over-the-counter medicines.  Any problems you or family members have had with anesthetic medicines.  Any blood disorders you have.  Any surgeries you have had.  Any medical conditions you have.  Whether you are pregnant or may be pregnant. What happens before the procedure? No special preparation is needed. Eat and drink normally. What happens during the procedure?  In order to produce an image of your heart, gel will be applied to your chest and a wand-like tool (transducer) will be moved over your chest. The gel will help transmit the sound waves from the transducer. The sound waves will harmlessly bounce off your heart to allow the heart images to be captured in real-time motion. These images will then be recorded.  You may need an IV  to receive a medicine that improves the quality of the pictures. What happens after the procedure? You may return to your normal schedule including diet, activities, and medicines, unless your health care provider tells you otherwise. This information is not intended to replace advice given to you by your health care provider. Make sure you discuss any questions you have with your health care provider. Document Released: 05/10/2000 Document Revised: 12/30/2015 Document Reviewed: 01/18/2013 Elsevier Interactive Patient Education  2017 [employer].

## 2018-04-28 ENCOUNTER — Telehealth (HOSPITAL_COMMUNITY): Payer: Self-pay | Admitting: *Deleted

## 2018-04-28 NOTE — Telephone Encounter (Signed)
Left message on voicemail in reference to upcoming appointment scheduled for 05/04/18. Phone number given for a call back so details instructions can be given. Percy Winterrowd, Ranae Palms

## 2018-04-30 ENCOUNTER — Telehealth (HOSPITAL_COMMUNITY): Payer: Self-pay | Admitting: *Deleted

## 2018-04-30 NOTE — Telephone Encounter (Signed)
Left message on voicemail in reference to upcoming appointment scheduled for 06/24/17. Phone number given for a call back so details instructions can be given.  Hannah Nicholson   

## 2018-05-04 ENCOUNTER — Other Ambulatory Visit (HOSPITAL_COMMUNITY): Payer: Medicare HMO

## 2018-05-04 ENCOUNTER — Encounter (HOSPITAL_COMMUNITY): Payer: Medicare HMO

## 2018-05-26 DIAGNOSIS — Z853 Personal history of malignant neoplasm of breast: Secondary | ICD-10-CM | POA: Diagnosis not present

## 2018-05-26 DIAGNOSIS — N83202 Unspecified ovarian cyst, left side: Secondary | ICD-10-CM | POA: Diagnosis not present

## 2018-05-26 DIAGNOSIS — Z01419 Encounter for gynecological examination (general) (routine) without abnormal findings: Secondary | ICD-10-CM | POA: Diagnosis not present

## 2018-06-09 DIAGNOSIS — N83202 Unspecified ovarian cyst, left side: Secondary | ICD-10-CM | POA: Diagnosis not present

## 2018-06-18 DIAGNOSIS — Z08 Encounter for follow-up examination after completed treatment for malignant neoplasm: Secondary | ICD-10-CM | POA: Diagnosis not present

## 2018-06-18 DIAGNOSIS — Z853 Personal history of malignant neoplasm of breast: Secondary | ICD-10-CM | POA: Diagnosis not present

## 2018-06-18 DIAGNOSIS — Z171 Estrogen receptor negative status [ER-]: Secondary | ICD-10-CM | POA: Diagnosis not present

## 2018-06-18 DIAGNOSIS — C50912 Malignant neoplasm of unspecified site of left female breast: Secondary | ICD-10-CM | POA: Diagnosis not present

## 2018-06-18 DIAGNOSIS — Z9012 Acquired absence of left breast and nipple: Secondary | ICD-10-CM | POA: Diagnosis not present

## 2018-07-13 DIAGNOSIS — R1013 Epigastric pain: Secondary | ICD-10-CM | POA: Diagnosis not present

## 2018-07-13 DIAGNOSIS — R42 Dizziness and giddiness: Secondary | ICD-10-CM | POA: Diagnosis not present

## 2018-08-31 DIAGNOSIS — M109 Gout, unspecified: Secondary | ICD-10-CM | POA: Diagnosis not present

## 2018-08-31 DIAGNOSIS — Z0000 Encounter for general adult medical examination without abnormal findings: Secondary | ICD-10-CM | POA: Diagnosis not present

## 2018-08-31 DIAGNOSIS — I10 Essential (primary) hypertension: Secondary | ICD-10-CM | POA: Diagnosis not present

## 2018-12-08 DIAGNOSIS — I10 Essential (primary) hypertension: Secondary | ICD-10-CM | POA: Diagnosis not present

## 2018-12-08 DIAGNOSIS — E049 Nontoxic goiter, unspecified: Secondary | ICD-10-CM | POA: Diagnosis not present

## 2018-12-08 DIAGNOSIS — E7800 Pure hypercholesterolemia, unspecified: Secondary | ICD-10-CM | POA: Diagnosis not present

## 2018-12-08 DIAGNOSIS — E119 Type 2 diabetes mellitus without complications: Secondary | ICD-10-CM | POA: Diagnosis not present

## 2018-12-14 DIAGNOSIS — E049 Nontoxic goiter, unspecified: Secondary | ICD-10-CM | POA: Diagnosis not present

## 2018-12-14 DIAGNOSIS — E039 Hypothyroidism, unspecified: Secondary | ICD-10-CM | POA: Diagnosis not present

## 2018-12-14 DIAGNOSIS — I10 Essential (primary) hypertension: Secondary | ICD-10-CM | POA: Diagnosis not present

## 2018-12-14 DIAGNOSIS — E119 Type 2 diabetes mellitus without complications: Secondary | ICD-10-CM | POA: Diagnosis not present

## 2018-12-14 DIAGNOSIS — E7800 Pure hypercholesterolemia, unspecified: Secondary | ICD-10-CM | POA: Diagnosis not present

## 2019-01-19 ENCOUNTER — Other Ambulatory Visit: Payer: Self-pay | Admitting: Family Medicine

## 2019-01-19 DIAGNOSIS — Z853 Personal history of malignant neoplasm of breast: Secondary | ICD-10-CM

## 2019-02-04 ENCOUNTER — Other Ambulatory Visit: Payer: Self-pay

## 2019-02-04 ENCOUNTER — Ambulatory Visit
Admission: RE | Admit: 2019-02-04 | Discharge: 2019-02-04 | Disposition: A | Discharge status: Home / Self Care | Payer: Medicare HMO | Source: Ambulatory Visit | Attending: Family Medicine | Admitting: Family Medicine

## 2019-02-04 DIAGNOSIS — R922 Inconclusive mammogram: Secondary | ICD-10-CM | POA: Diagnosis not present

## 2019-02-04 DIAGNOSIS — D0510 Intraductal carcinoma in situ of unspecified breast: Secondary | ICD-10-CM | POA: Diagnosis not present

## 2019-02-04 DIAGNOSIS — Z853 Personal history of malignant neoplasm of breast: Secondary | ICD-10-CM

## 2019-02-18 DIAGNOSIS — M25569 Pain in unspecified knee: Secondary | ICD-10-CM | POA: Diagnosis not present

## 2019-02-18 DIAGNOSIS — M5416 Radiculopathy, lumbar region: Secondary | ICD-10-CM | POA: Diagnosis not present

## 2019-02-24 DIAGNOSIS — M545 Low back pain: Secondary | ICD-10-CM | POA: Diagnosis not present

## 2019-02-24 DIAGNOSIS — M25562 Pain in left knee: Secondary | ICD-10-CM | POA: Diagnosis not present

## 2019-03-01 DIAGNOSIS — M6281 Muscle weakness (generalized): Secondary | ICD-10-CM | POA: Diagnosis not present

## 2019-03-01 DIAGNOSIS — M5416 Radiculopathy, lumbar region: Secondary | ICD-10-CM | POA: Diagnosis not present

## 2019-03-02 DIAGNOSIS — Z23 Encounter for immunization: Secondary | ICD-10-CM | POA: Diagnosis not present

## 2019-03-03 DIAGNOSIS — M5416 Radiculopathy, lumbar region: Secondary | ICD-10-CM | POA: Diagnosis not present

## 2019-03-03 DIAGNOSIS — S83512D Sprain of anterior cruciate ligament of left knee, subsequent encounter: Secondary | ICD-10-CM | POA: Diagnosis not present

## 2019-03-10 DIAGNOSIS — Z20828 Contact with and (suspected) exposure to other viral communicable diseases: Secondary | ICD-10-CM | POA: Diagnosis not present

## 2019-03-18 DIAGNOSIS — M6281 Muscle weakness (generalized): Secondary | ICD-10-CM | POA: Diagnosis not present

## 2019-03-18 DIAGNOSIS — M5416 Radiculopathy, lumbar region: Secondary | ICD-10-CM | POA: Diagnosis not present

## 2019-03-26 DIAGNOSIS — M545 Low back pain: Secondary | ICD-10-CM | POA: Diagnosis not present

## 2019-03-26 DIAGNOSIS — M25562 Pain in left knee: Secondary | ICD-10-CM | POA: Diagnosis not present

## 2019-04-01 DIAGNOSIS — H5203 Hypermetropia, bilateral: Secondary | ICD-10-CM | POA: Diagnosis not present

## 2019-04-01 DIAGNOSIS — H52223 Regular astigmatism, bilateral: Secondary | ICD-10-CM | POA: Diagnosis not present

## 2019-04-01 DIAGNOSIS — Z08 Encounter for follow-up examination after completed treatment for malignant neoplasm: Secondary | ICD-10-CM | POA: Diagnosis not present

## 2019-04-01 DIAGNOSIS — H524 Presbyopia: Secondary | ICD-10-CM | POA: Diagnosis not present

## 2019-04-01 DIAGNOSIS — Z171 Estrogen receptor negative status [ER-]: Secondary | ICD-10-CM | POA: Diagnosis not present

## 2019-04-01 DIAGNOSIS — C50912 Malignant neoplasm of unspecified site of left female breast: Secondary | ICD-10-CM | POA: Diagnosis not present

## 2019-04-01 DIAGNOSIS — Z9012 Acquired absence of left breast and nipple: Secondary | ICD-10-CM | POA: Diagnosis not present

## 2019-04-01 DIAGNOSIS — Z853 Personal history of malignant neoplasm of breast: Secondary | ICD-10-CM | POA: Diagnosis not present

## 2019-04-01 DIAGNOSIS — E119 Type 2 diabetes mellitus without complications: Secondary | ICD-10-CM | POA: Diagnosis not present

## 2019-04-01 DIAGNOSIS — H40013 Open angle with borderline findings, low risk, bilateral: Secondary | ICD-10-CM | POA: Diagnosis not present

## 2019-04-01 DIAGNOSIS — H2513 Age-related nuclear cataract, bilateral: Secondary | ICD-10-CM | POA: Diagnosis not present

## 2019-04-01 DIAGNOSIS — Z79899 Other long term (current) drug therapy: Secondary | ICD-10-CM | POA: Diagnosis not present

## 2019-04-05 DIAGNOSIS — E119 Type 2 diabetes mellitus without complications: Secondary | ICD-10-CM | POA: Diagnosis not present

## 2019-04-05 DIAGNOSIS — E7800 Pure hypercholesterolemia, unspecified: Secondary | ICD-10-CM | POA: Diagnosis not present

## 2019-04-05 DIAGNOSIS — E039 Hypothyroidism, unspecified: Secondary | ICD-10-CM | POA: Diagnosis not present

## 2019-04-12 DIAGNOSIS — E039 Hypothyroidism, unspecified: Secondary | ICD-10-CM | POA: Diagnosis not present

## 2019-04-12 DIAGNOSIS — E049 Nontoxic goiter, unspecified: Secondary | ICD-10-CM | POA: Diagnosis not present

## 2019-04-12 DIAGNOSIS — E119 Type 2 diabetes mellitus without complications: Secondary | ICD-10-CM | POA: Diagnosis not present

## 2019-04-12 DIAGNOSIS — I10 Essential (primary) hypertension: Secondary | ICD-10-CM | POA: Diagnosis not present

## 2019-04-12 DIAGNOSIS — E7800 Pure hypercholesterolemia, unspecified: Secondary | ICD-10-CM | POA: Diagnosis not present

## 2019-05-31 DIAGNOSIS — N839 Noninflammatory disorder of ovary, fallopian tube and broad ligament, unspecified: Secondary | ICD-10-CM | POA: Diagnosis not present

## 2019-05-31 DIAGNOSIS — Z01419 Encounter for gynecological examination (general) (routine) without abnormal findings: Secondary | ICD-10-CM | POA: Diagnosis not present

## 2019-05-31 DIAGNOSIS — Z853 Personal history of malignant neoplasm of breast: Secondary | ICD-10-CM | POA: Diagnosis not present

## 2019-05-31 DIAGNOSIS — R69 Illness, unspecified: Secondary | ICD-10-CM | POA: Diagnosis not present

## 2019-06-29 DIAGNOSIS — N839 Noninflammatory disorder of ovary, fallopian tube and broad ligament, unspecified: Secondary | ICD-10-CM | POA: Diagnosis not present

## 2019-08-02 DIAGNOSIS — E119 Type 2 diabetes mellitus without complications: Secondary | ICD-10-CM | POA: Diagnosis not present

## 2019-08-02 DIAGNOSIS — E7800 Pure hypercholesterolemia, unspecified: Secondary | ICD-10-CM | POA: Diagnosis not present

## 2019-08-02 DIAGNOSIS — E039 Hypothyroidism, unspecified: Secondary | ICD-10-CM | POA: Diagnosis not present

## 2019-08-17 DIAGNOSIS — I10 Essential (primary) hypertension: Secondary | ICD-10-CM | POA: Diagnosis not present

## 2019-08-17 DIAGNOSIS — E039 Hypothyroidism, unspecified: Secondary | ICD-10-CM | POA: Diagnosis not present

## 2019-08-17 DIAGNOSIS — E7800 Pure hypercholesterolemia, unspecified: Secondary | ICD-10-CM | POA: Diagnosis not present

## 2019-08-17 DIAGNOSIS — E119 Type 2 diabetes mellitus without complications: Secondary | ICD-10-CM | POA: Diagnosis not present

## 2019-08-17 DIAGNOSIS — E049 Nontoxic goiter, unspecified: Secondary | ICD-10-CM | POA: Diagnosis not present

## 2019-08-24 DIAGNOSIS — R69 Illness, unspecified: Secondary | ICD-10-CM | POA: Diagnosis not present

## 2019-09-01 DIAGNOSIS — Z0000 Encounter for general adult medical examination without abnormal findings: Secondary | ICD-10-CM | POA: Diagnosis not present

## 2019-09-01 DIAGNOSIS — M542 Cervicalgia: Secondary | ICD-10-CM | POA: Diagnosis not present

## 2019-09-01 DIAGNOSIS — I10 Essential (primary) hypertension: Secondary | ICD-10-CM | POA: Diagnosis not present

## 2019-09-01 DIAGNOSIS — Z853 Personal history of malignant neoplasm of breast: Secondary | ICD-10-CM | POA: Diagnosis not present

## 2019-09-01 DIAGNOSIS — E559 Vitamin D deficiency, unspecified: Secondary | ICD-10-CM | POA: Diagnosis not present

## 2019-09-01 DIAGNOSIS — G479 Sleep disorder, unspecified: Secondary | ICD-10-CM | POA: Diagnosis not present

## 2019-09-01 DIAGNOSIS — Z23 Encounter for immunization: Secondary | ICD-10-CM | POA: Diagnosis not present

## 2019-09-01 DIAGNOSIS — E041 Nontoxic single thyroid nodule: Secondary | ICD-10-CM | POA: Diagnosis not present

## 2019-09-01 DIAGNOSIS — M109 Gout, unspecified: Secondary | ICD-10-CM | POA: Diagnosis not present

## 2019-09-01 DIAGNOSIS — R69 Illness, unspecified: Secondary | ICD-10-CM | POA: Diagnosis not present

## 2019-09-20 ENCOUNTER — Emergency Department (HOSPITAL_BASED_OUTPATIENT_CLINIC_OR_DEPARTMENT_OTHER): Payer: Medicare HMO

## 2019-09-20 ENCOUNTER — Encounter (HOSPITAL_BASED_OUTPATIENT_CLINIC_OR_DEPARTMENT_OTHER): Payer: Self-pay

## 2019-09-20 ENCOUNTER — Encounter (HOSPITAL_COMMUNITY)
Admission: EM | Disposition: A | Discharge status: Home / Self Care | Payer: Self-pay | Source: Home / Self Care | Attending: Urology

## 2019-09-20 ENCOUNTER — Other Ambulatory Visit: Payer: Self-pay

## 2019-09-20 ENCOUNTER — Inpatient Hospital Stay (HOSPITAL_BASED_OUTPATIENT_CLINIC_OR_DEPARTMENT_OTHER)
Admission: EM | Admit: 2019-09-20 | Discharge: 2019-09-23 | DRG: 854 | Disposition: A | Discharge status: Home / Self Care | Payer: Medicare HMO | Attending: Urology | Admitting: Urology

## 2019-09-20 ENCOUNTER — Observation Stay (HOSPITAL_COMMUNITY): Payer: Medicare HMO | Admitting: Certified Registered Nurse Anesthetist

## 2019-09-20 ENCOUNTER — Observation Stay (HOSPITAL_COMMUNITY): Payer: Medicare HMO

## 2019-09-20 DIAGNOSIS — Z832 Family history of diseases of the blood and blood-forming organs and certain disorders involving the immune mechanism: Secondary | ICD-10-CM

## 2019-09-20 DIAGNOSIS — Z79899 Other long term (current) drug therapy: Secondary | ICD-10-CM | POA: Diagnosis not present

## 2019-09-20 DIAGNOSIS — Z801 Family history of malignant neoplasm of trachea, bronchus and lung: Secondary | ICD-10-CM

## 2019-09-20 DIAGNOSIS — B9620 Unspecified Escherichia coli [E. coli] as the cause of diseases classified elsewhere: Secondary | ICD-10-CM | POA: Diagnosis not present

## 2019-09-20 DIAGNOSIS — I10 Essential (primary) hypertension: Secondary | ICD-10-CM | POA: Diagnosis not present

## 2019-09-20 DIAGNOSIS — K5790 Diverticulosis of intestine, part unspecified, without perforation or abscess without bleeding: Secondary | ICD-10-CM | POA: Diagnosis present

## 2019-09-20 DIAGNOSIS — J45909 Unspecified asthma, uncomplicated: Secondary | ICD-10-CM | POA: Diagnosis not present

## 2019-09-20 DIAGNOSIS — M109 Gout, unspecified: Secondary | ICD-10-CM | POA: Diagnosis not present

## 2019-09-20 DIAGNOSIS — Z9012 Acquired absence of left breast and nipple: Secondary | ICD-10-CM

## 2019-09-20 DIAGNOSIS — Z79890 Hormone replacement therapy (postmenopausal): Secondary | ICD-10-CM | POA: Diagnosis not present

## 2019-09-20 DIAGNOSIS — A4151 Sepsis due to Escherichia coli [E. coli]: Principal | ICD-10-CM | POA: Diagnosis present

## 2019-09-20 DIAGNOSIS — N136 Pyonephrosis: Secondary | ICD-10-CM | POA: Diagnosis present

## 2019-09-20 DIAGNOSIS — E119 Type 2 diabetes mellitus without complications: Secondary | ICD-10-CM | POA: Diagnosis not present

## 2019-09-20 DIAGNOSIS — Z803 Family history of malignant neoplasm of breast: Secondary | ICD-10-CM

## 2019-09-20 DIAGNOSIS — Z90710 Acquired absence of both cervix and uterus: Secondary | ICD-10-CM

## 2019-09-20 DIAGNOSIS — E039 Hypothyroidism, unspecified: Secondary | ICD-10-CM | POA: Diagnosis present

## 2019-09-20 DIAGNOSIS — Z91041 Radiographic dye allergy status: Secondary | ICD-10-CM | POA: Diagnosis not present

## 2019-09-20 DIAGNOSIS — E7800 Pure hypercholesterolemia, unspecified: Secondary | ICD-10-CM | POA: Diagnosis not present

## 2019-09-20 DIAGNOSIS — N12 Tubulo-interstitial nephritis, not specified as acute or chronic: Secondary | ICD-10-CM | POA: Diagnosis not present

## 2019-09-20 DIAGNOSIS — Z20822 Contact with and (suspected) exposure to covid-19: Secondary | ICD-10-CM | POA: Diagnosis not present

## 2019-09-20 DIAGNOSIS — R6510 Systemic inflammatory response syndrome (SIRS) of non-infectious origin without acute organ dysfunction: Secondary | ICD-10-CM | POA: Diagnosis not present

## 2019-09-20 DIAGNOSIS — K219 Gastro-esophageal reflux disease without esophagitis: Secondary | ICD-10-CM | POA: Diagnosis not present

## 2019-09-20 DIAGNOSIS — K5900 Constipation, unspecified: Secondary | ICD-10-CM | POA: Diagnosis not present

## 2019-09-20 DIAGNOSIS — Z853 Personal history of malignant neoplasm of breast: Secondary | ICD-10-CM | POA: Diagnosis not present

## 2019-09-20 DIAGNOSIS — N132 Hydronephrosis with renal and ureteral calculous obstruction: Secondary | ICD-10-CM | POA: Diagnosis not present

## 2019-09-20 DIAGNOSIS — C50912 Malignant neoplasm of unspecified site of left female breast: Secondary | ICD-10-CM | POA: Diagnosis not present

## 2019-09-20 DIAGNOSIS — R509 Fever, unspecified: Secondary | ICD-10-CM | POA: Diagnosis not present

## 2019-09-20 DIAGNOSIS — N201 Calculus of ureter: Secondary | ICD-10-CM | POA: Diagnosis not present

## 2019-09-20 DIAGNOSIS — N390 Urinary tract infection, site not specified: Secondary | ICD-10-CM | POA: Diagnosis not present

## 2019-09-20 HISTORY — PX: CYSTOSCOPY W/ URETERAL STENT PLACEMENT: SHX1429

## 2019-09-20 LAB — COMPREHENSIVE METABOLIC PANEL
ALT: 37 U/L (ref 0–44)
AST: 37 U/L (ref 15–41)
Albumin: 4.4 g/dL (ref 3.5–5.0)
Alkaline Phosphatase: 75 U/L (ref 38–126)
Anion gap: 11 (ref 5–15)
BUN: 14 mg/dL (ref 8–23)
CO2: 23 mmol/L (ref 22–32)
Calcium: 9.8 mg/dL (ref 8.9–10.3)
Chloride: 98 mmol/L (ref 98–111)
Creatinine, Ser: 1.02 mg/dL — ABNORMAL HIGH (ref 0.44–1.00)
GFR calc Af Amer: >60 mL/min (ref >60)
GFR calc non Af Amer: 57 mL/min — ABNORMAL LOW (ref >60)
Glucose, Bld: 143 mg/dL — ABNORMAL HIGH (ref 70–99)
Potassium: 3.7 mmol/L (ref 3.5–5.1)
Sodium: 132 mmol/L — ABNORMAL LOW (ref 135–145)
Total Bilirubin: 0.9 mg/dL (ref 0.3–1.2)
Total Protein: 7 g/dL (ref 6.5–8.1)

## 2019-09-20 LAB — LACTIC ACID, PLASMA
Lactic Acid, Venous: 2 mmol/L (ref 0.5–1.9)
Lactic Acid, Venous: 2.7 mmol/L (ref 0.5–1.9)

## 2019-09-20 LAB — CBC WITH DIFFERENTIAL/PLATELET
Abs Immature Granulocytes: 0.03 10*3/uL (ref 0.00–0.07)
Basophils Absolute: 0 10*3/uL (ref 0.0–0.1)
Basophils Relative: 1 %
Eosinophils Absolute: 0 10*3/uL (ref 0.0–0.5)
Eosinophils Relative: 0 %
HCT: 38.2 % (ref 36.0–46.0)
Hemoglobin: 13.4 g/dL (ref 12.0–15.0)
Immature Granulocytes: 0 %
Lymphocytes Relative: 8 %
Lymphs Abs: 0.7 10*3/uL (ref 0.7–4.0)
MCH: 31.1 pg (ref 26.0–34.0)
MCHC: 35.1 g/dL (ref 30.0–36.0)
MCV: 88.6 fL (ref 80.0–100.0)
Monocytes Absolute: 0.4 10*3/uL (ref 0.1–1.0)
Monocytes Relative: 5 %
Neutro Abs: 7.5 10*3/uL (ref 1.7–7.7)
Neutrophils Relative %: 86 %
Platelets: 209 10*3/uL (ref 150–400)
RBC: 4.31 MIL/uL (ref 3.87–5.11)
RDW: 12.2 % (ref 11.5–15.5)
WBC: 8.6 10*3/uL (ref 4.0–10.5)
nRBC: 0 % (ref 0.0–0.2)

## 2019-09-20 LAB — URINALYSIS, ROUTINE W REFLEX MICROSCOPIC
Bilirubin Urine: NEGATIVE
Glucose, UA: NEGATIVE mg/dL
Ketones, ur: NEGATIVE mg/dL
Nitrite: POSITIVE — AB
Protein, ur: NEGATIVE mg/dL
Specific Gravity, Urine: <1.005 — ABNORMAL LOW (ref 1.005–1.030)
pH: 6 (ref 5.0–8.0)

## 2019-09-20 LAB — URINALYSIS, MICROSCOPIC (REFLEX)

## 2019-09-20 LAB — APTT: aPTT: 25 seconds (ref 24–36)

## 2019-09-20 LAB — RESPIRATORY PANEL BY RT PCR (FLU A&B, COVID)
Influenza A by PCR: NEGATIVE
Influenza B by PCR: NEGATIVE
SARS Coronavirus 2 by RT PCR: NEGATIVE

## 2019-09-20 LAB — GLUCOSE, CAPILLARY: Glucose-Capillary: 160 mg/dL — ABNORMAL HIGH (ref 70–99)

## 2019-09-20 LAB — LIPASE, BLOOD: Lipase: 27 U/L (ref 11–51)

## 2019-09-20 LAB — PROTIME-INR
INR: 1 (ref 0.8–1.2)
Prothrombin Time: 13 seconds (ref 11.4–15.2)

## 2019-09-20 SURGERY — CYSTOSCOPY, WITH RETROGRADE PYELOGRAM AND URETERAL STENT INSERTION
Anesthesia: General | Laterality: Left

## 2019-09-20 MED ORDER — OXYCODONE HCL 5 MG PO TABS
5.0000 mg | ORAL_TABLET | Freq: Once | ORAL | Status: DC | PRN
Start: 2019-09-20 — End: 2019-09-21

## 2019-09-20 MED ORDER — METFORMIN HCL ER 500 MG PO TB24: 1000.0000 mg | ORAL_TABLET | Freq: Two times a day (BID) | ORAL | Status: DC

## 2019-09-20 MED ORDER — DIPHENHYDRAMINE HCL 50 MG/ML IJ SOLN: 12.5000 mg | Freq: Four times a day (QID) | INTRAMUSCULAR | Status: DC | PRN

## 2019-09-20 MED ORDER — SODIUM CHLORIDE 0.9 % IV BOLUS (SEPSIS)
1000.0000 mL | Freq: Once | INTRAVENOUS | Status: AC
  Administered 2019-09-20: 1000 mL via INTRAVENOUS

## 2019-09-20 MED ORDER — SODIUM CHLORIDE 0.9 % IV SOLN
INTRAVENOUS | Status: AC
  Filled 2019-09-20: qty 20

## 2019-09-20 MED ORDER — IOHEXOL 300 MG/ML  SOLN
INTRAMUSCULAR | Status: DC | PRN
  Administered 2019-09-20: 8 mL

## 2019-09-20 MED ORDER — HYDROMORPHONE HCL 1 MG/ML IJ SOLN
0.2500 mg | INTRAMUSCULAR | Status: DC | PRN
Start: 2019-09-20 — End: 2019-09-21
  Administered 2019-09-20: 0.5 mg via INTRAVENOUS

## 2019-09-20 MED ORDER — PROPOFOL 10 MG/ML IV BOLUS
INTRAVENOUS | Status: DC | PRN
  Administered 2019-09-20: 150 mg via INTRAVENOUS

## 2019-09-20 MED ORDER — SERTRALINE HCL 50 MG PO TABS
50.0000 mg | ORAL_TABLET | Freq: Every day | ORAL | Status: DC
  Administered 2019-09-21 – 2019-09-23 (×3): 50 mg via ORAL
  Filled 2019-09-20 (×3): qty 1

## 2019-09-20 MED ORDER — METOPROLOL SUCCINATE ER 25 MG PO TB24
25.0000 mg | ORAL_TABLET | Freq: Every day | ORAL | Status: DC
  Administered 2019-09-21 – 2019-09-23 (×3): 25 mg via ORAL
  Filled 2019-09-20 (×3): qty 1

## 2019-09-20 MED ORDER — TAMSULOSIN HCL 0.4 MG PO CAPS: 0.4000 mg | ORAL_CAPSULE | Freq: Every day | ORAL | 0 refills | Status: DC

## 2019-09-20 MED ORDER — ONDANSETRON HCL 4 MG/2ML IJ SOLN
INTRAMUSCULAR | Status: AC
  Filled 2019-09-20: qty 2

## 2019-09-20 MED ORDER — DEXAMETHASONE SODIUM PHOSPHATE 10 MG/ML IJ SOLN
INTRAMUSCULAR | Status: AC
  Filled 2019-09-20: qty 1

## 2019-09-20 MED ORDER — SODIUM CHLORIDE 0.9 % IV SOLN
2.0000 g | Freq: Once | INTRAVENOUS | Status: AC
  Administered 2019-09-20: 2 g via INTRAVENOUS

## 2019-09-20 MED ORDER — PROPOFOL 10 MG/ML IV BOLUS
INTRAVENOUS | Status: AC
  Filled 2019-09-20: qty 20

## 2019-09-20 MED ORDER — ONDANSETRON HCL 4 MG/2ML IJ SOLN
4.0000 mg | Freq: Once | INTRAMUSCULAR | Status: AC
  Administered 2019-09-20: 4 mg via INTRAVENOUS

## 2019-09-20 MED ORDER — PHENYLEPHRINE HCL (PRESSORS) 10 MG/ML IV SOLN
INTRAVENOUS | Status: AC
  Filled 2019-09-20: qty 1

## 2019-09-20 MED ORDER — SODIUM CHLORIDE 0.9% FLUSH
3.0000 mL | Freq: Once | INTRAVENOUS | Status: DC
  Filled 2019-09-20: qty 3

## 2019-09-20 MED ORDER — CEPHALEXIN 500 MG PO CAPS: 500.0000 mg | ORAL_CAPSULE | Freq: Four times a day (QID) | ORAL | 0 refills | Status: DC

## 2019-09-20 MED ORDER — ACETAMINOPHEN 325 MG PO TABS
ORAL_TABLET | ORAL | Status: AC
  Filled 2019-09-20: qty 2

## 2019-09-20 MED ORDER — ONDANSETRON HCL 4 MG/2ML IJ SOLN: 4.0000 mg | INTRAMUSCULAR | Status: DC | PRN

## 2019-09-20 MED ORDER — ACETAMINOPHEN 325 MG PO TABS
650.0000 mg | ORAL_TABLET | Freq: Once | ORAL | Status: AC
  Administered 2019-09-20: 650 mg via ORAL

## 2019-09-20 MED ORDER — LIDOCAINE 2% (20 MG/ML) 5 ML SYRINGE
INTRAMUSCULAR | Status: AC
  Filled 2019-09-20: qty 5

## 2019-09-20 MED ORDER — LIDOCAINE 2% (20 MG/ML) 5 ML SYRINGE
INTRAMUSCULAR | Status: DC | PRN
  Administered 2019-09-20: 80 mg via INTRAVENOUS

## 2019-09-20 MED ORDER — MORPHINE SULFATE (PF) 4 MG/ML IV SOLN
INTRAVENOUS | Status: AC
  Filled 2019-09-20: qty 1

## 2019-09-20 MED ORDER — PHENAZOPYRIDINE HCL 100 MG PO TABS
100.0000 mg | ORAL_TABLET | Freq: Three times a day (TID) | ORAL | 0 refills | Status: DC | PRN
Start: 2019-09-20 — End: 2019-10-19

## 2019-09-20 MED ORDER — OXYBUTYNIN CHLORIDE 5 MG PO TABS: 5.0000 mg | ORAL_TABLET | Freq: Three times a day (TID) | ORAL | Status: DC | PRN

## 2019-09-20 MED ORDER — MORPHINE SULFATE (PF) 2 MG/ML IV SOLN: 2.0000 mg | INTRAVENOUS | Status: DC | PRN

## 2019-09-20 MED ORDER — LORATADINE 10 MG PO TABS
10.0000 mg | ORAL_TABLET | Freq: Every day | ORAL | Status: DC
  Administered 2019-09-22 – 2019-09-23 (×2): 10 mg via ORAL
  Filled 2019-09-20 (×3): qty 1

## 2019-09-20 MED ORDER — FENTANYL CITRATE (PF) 100 MCG/2ML IJ SOLN
INTRAMUSCULAR | Status: DC | PRN
  Administered 2019-09-20: 50 ug via INTRAVENOUS

## 2019-09-20 MED ORDER — TIZANIDINE HCL 4 MG PO TABS: 2.0000 mg | ORAL_TABLET | Freq: Three times a day (TID) | ORAL | Status: DC | PRN

## 2019-09-20 MED ORDER — OXYBUTYNIN CHLORIDE 5 MG PO TABS: 5.0000 mg | ORAL_TABLET | Freq: Three times a day (TID) | ORAL | 0 refills | Status: DC | PRN

## 2019-09-20 MED ORDER — ONDANSETRON HCL 4 MG/2ML IJ SOLN
INTRAMUSCULAR | Status: DC | PRN
  Administered 2019-09-20: 4 mg via INTRAVENOUS

## 2019-09-20 MED ORDER — MORPHINE SULFATE (PF) 4 MG/ML IV SOLN
4.0000 mg | Freq: Once | INTRAVENOUS | Status: AC
  Administered 2019-09-20: 4 mg via INTRAVENOUS

## 2019-09-20 MED ORDER — LEVOTHYROXINE SODIUM 50 MCG PO TABS
50.0000 ug | ORAL_TABLET | Freq: Every day | ORAL | Status: DC
  Administered 2019-09-21 – 2019-09-23 (×3): 50 ug via ORAL
  Filled 2019-09-20 (×3): qty 1

## 2019-09-20 MED ORDER — HYDROMORPHONE HCL 1 MG/ML IJ SOLN
INTRAMUSCULAR | Status: AC
  Filled 2019-09-20: qty 1

## 2019-09-20 MED ORDER — DIPHENHYDRAMINE HCL 12.5 MG/5ML PO ELIX: 12.5000 mg | ORAL_SOLUTION | Freq: Four times a day (QID) | ORAL | Status: DC | PRN

## 2019-09-20 MED ORDER — ACETAMINOPHEN 325 MG PO TABS
650.0000 mg | ORAL_TABLET | ORAL | Status: DC | PRN
  Administered 2019-09-21: 650 mg via ORAL
  Filled 2019-09-20: qty 2

## 2019-09-20 MED ORDER — DEXAMETHASONE SODIUM PHOSPHATE 10 MG/ML IJ SOLN
INTRAMUSCULAR | Status: DC | PRN
  Administered 2019-09-20: 4 mg via INTRAVENOUS

## 2019-09-20 MED ORDER — METRONIDAZOLE IN NACL 5-0.79 MG/ML-% IV SOLN
500.0000 mg | Freq: Once | INTRAVENOUS | Status: AC
  Administered 2019-09-20: 500 mg via INTRAVENOUS
  Filled 2019-09-20: qty 100

## 2019-09-20 MED ORDER — OXYCODONE HCL 5 MG/5ML PO SOLN
5.0000 mg | Freq: Once | ORAL | Status: DC | PRN
Start: 2019-09-20 — End: 2019-09-21

## 2019-09-20 MED ORDER — MORPHINE SULFATE (PF) 4 MG/ML IV SOLN
4.0000 mg | Freq: Once | INTRAVENOUS | Status: AC
  Administered 2019-09-20: 4 mg via INTRAVENOUS
  Filled 2019-09-20: qty 1

## 2019-09-20 MED ORDER — FENTANYL CITRATE (PF) 100 MCG/2ML IJ SOLN
INTRAMUSCULAR | Status: AC
  Filled 2019-09-20: qty 2

## 2019-09-20 MED ORDER — OXYCODONE HCL 5 MG PO TABS
5.0000 mg | ORAL_TABLET | ORAL | Status: DC | PRN
  Administered 2019-09-21 – 2019-09-23 (×8): 5 mg via ORAL
  Filled 2019-09-20 (×8): qty 1

## 2019-09-20 MED ORDER — PROMETHAZINE HCL 25 MG/ML IJ SOLN
6.2500 mg | INTRAMUSCULAR | Status: DC | PRN
Start: 2019-09-20 — End: 2019-09-21

## 2019-09-20 MED ORDER — SODIUM CHLORIDE 0.9 % IR SOLN
Status: DC | PRN
  Administered 2019-09-20: 3000 mL via INTRAVESICAL

## 2019-09-20 MED ORDER — LACTATED RINGERS IV SOLN: INTRAVENOUS | Status: DC | PRN

## 2019-09-20 MED ORDER — DOCUSATE SODIUM 100 MG PO CAPS
100.0000 mg | ORAL_CAPSULE | Freq: Two times a day (BID) | ORAL | Status: DC
  Administered 2019-09-21 – 2019-09-23 (×5): 100 mg via ORAL
  Filled 2019-09-20 (×5): qty 1

## 2019-09-20 SURGICAL SUPPLY — 13 items
BAG URO CATCHER STRL LF (MISCELLANEOUS) ×2 IMPLANT
CATH INTERMIT  6FR 70CM (CATHETERS) ×2 IMPLANT
CLOTH BEACON ORANGE TIMEOUT ST (SAFETY) ×2 IMPLANT
GLOVE BIO SURGEON STRL SZ 6 (GLOVE) ×2 IMPLANT
GOWN STRL REUS W/ TWL LRG LVL3 (GOWN DISPOSABLE) ×1 IMPLANT
GOWN STRL REUS W/TWL LRG LVL3 (GOWN DISPOSABLE) ×6 IMPLANT
GUIDEWIRE STR DUAL SENSOR (WIRE) ×2 IMPLANT
KIT TURNOVER KIT A (KITS) IMPLANT
MANIFOLD NEPTUNE II (INSTRUMENTS) ×2 IMPLANT
STENT URET 6FRX24 CONTOUR (STENTS) ×1 IMPLANT
TRAY CYSTO PACK (CUSTOM PROCEDURE TRAY) ×2 IMPLANT
TUBING CONNECTING 10 (TUBING) ×2 IMPLANT
TUBING UROLOGY SET (TUBING) IMPLANT

## 2019-09-20 NOTE — Anesthesia Postprocedure Evaluation (Signed)
Anesthesia Post Note  Patient: Hannah Nicholson  Procedure(s) Performed: CYSTOSCOPY WITH RETROGRADE PYELOGRAM/URETERAL STENT PLACEMENT (Left )     Patient location during evaluation: PACU Anesthesia Type: General Level of consciousness: awake and alert Pain management: pain level controlled Vital Signs Assessment: post-procedure vital signs reviewed and stable Respiratory status: spontaneous breathing, nonlabored ventilation and respiratory function stable Cardiovascular status: blood pressure returned to baseline and stable Postop Assessment: no apparent nausea or vomiting Anesthetic complications: no    Last Vitals:  Vitals:   09/20/19 2233 09/20/19 2245  BP: 134/76 127/82  Pulse: 96 100  Resp: (!) 23 19  Temp: 37.4 C   SpO2: 100% 93%    Last Pain:  Vitals:   09/20/19 2233  TempSrc:   PainSc: 0-No pain                 Lynda Rainwater

## 2019-09-20 NOTE — Consult Note (Addendum)
I have been asked to see the patient by Dr. Danella Sensing, for evaluation and management of left ureteral stone with fever.  History of present illness: 69 yo woman presented to [facility] ED with fever to 103 and left flank pain.  CT abd/pelvis shows 6 mm proximal left ureteral calculus with hydro.  UA +nitrates, WBCs and RBCs.  Patient has history of stones and has had ESWL with Dr. Prescott Parma in the past.   Review of systems: A 12 point comprehensive review of systems was obtained and is negative unless otherwise stated in the history of present illness.  Patient Active Problem List   Diagnosis Date Noted  . 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 Conjunctivitis of both eyes 11/19/2010  . SARCOIDOSIS 09/06/2008  . Seasonal and perennial allergic rhinitis 09/06/2008  . Asthma with acute exacerbation 09/06/2008  . ESOPHAGEAL REFLUX 09/06/2008    No current facility-administered medications on file prior to encounter.   Current Outpatient Medications on File Prior to Encounter  Medication Sig Dispense Refill  . Cholecalciferol (VITAMIN D-3) 5000 UNITS TABS Take 1 tablet by mouth daily.    . fexofenadine (ALLEGRA) 180 MG tablet Take 180 mg by mouth daily.      . hydrochlorothiazide (HYDRODIURIL) 25 MG tablet Take 1 tablet by mouth daily.    Marland Kitchen HYDROcodone-acetaminophen (NORCO/VICODIN) 5-325 MG per tablet Take 1 tablet by mouth every 6 (six) hours as needed for moderate pain.    Marland Kitchen ibuprofen (ADVIL,MOTRIN) 600 MG tablet Take 1 tablet by mouth as needed.  0  . levothyroxine (SYNTHROID, LEVOTHROID) 25 MCG tablet Take 25 mcg by mouth daily before breakfast.    . metoprolol succinate (TOPROL-XL) 25 MG 24 hr tablet Take 1 tablet by mouth daily.    . Multiple Vitamin (MULTIVITAMIN) capsule Take 1 capsule by mouth daily.      Marland Kitchen telmisartan (MICARDIS) 80 MG tablet Take 1 tablet by mouth daily.      Past Medical History:  Diagnosis Date  . Allergic rhinitis, cause  unspecified   . Anxiety   . Chronic kidney disease    kidney stones  . Diabetes ([place])   . Diverticulitis   . Esophageal reflux   . Gastroesophageal reflux disease without esophagitis   . Gout   . History of colonic polyps   . HTN (hypertension)   . Hypothyroidism   . Low back pain   . Nontoxic single thyroid nodule   . Personal history of malignant neoplasm of breast   . Pure hypercholesterolemia, unspecified   . Sarcoidosis   . Sleep disturbance   . Stress reaction   . Unspecified asthma(493.90)   . Vaginal atrophy   . Vitamin D deficiency     Past Surgical History:  Procedure Laterality Date  . BREAST LUMPECTOMY Left    BREAST CANCER AND RADIATION, NO CHEMO  . CHOLECYSTECTOMY    . COLONOSCOPY     TUBULAR ADENOMA  . HEMORRHOID SURGERY    . LAPAROSCOPIC INGUINAL HERNIA WITH UMBILICAL HERNIA    . MASTECTOMY Left 2016  . sinus surgery    . TONSILLECTOMY    . TOTAL ABDOMINAL HYSTERECTOMY    . TUBAL LIGATION      Social History   Tobacco Use  . Smoking status: Never Smoker  . Smokeless tobacco: Never Used  Substance Use Topics  . Alcohol use: No  . Drug use: No    Family History  Problem Relation Age of Onset  .  Asthma Father   . Heart disease Mother   . Prostate cancer Brother 31  . Prostate cancer Maternal Uncle        dx 16s  . Lupus Sister   . Breast cancer Sister 24  . Stroke Sister 37  . Prostate cancer Maternal Uncle        dx 50s  . Lung cancer Cousin        thought to be due to sarcoidosis    PE: Vitals:   09/20/19 1929 09/20/19 1938 09/20/19 2000 09/20/19 2031  BP: (!) 164/98  (!) 161/78 (!) 188/96  Pulse: 99  87 90  Resp: (!) 25  (!) 22 (!) 21  Temp:  (!) 100.6 F (38.1 C)    TempSrc:  Oral    SpO2: 96%  93% 96%  Weight:      Height:       Patient appears to be in no acute distress  patient is alert and oriented x3 Atraumatic normocephalic head No cervical or supraclavicular lymphadenopathy appreciated No increased work of  breathing Regular sinus rhythm/rate Abdomen is soft, nontender, nondistended Lower extremities are symmetric without appreciable edema Grossly neurologically intact No identifiable skin lesions  Recent Labs    09/20/19 1708  WBC 8.6  HGB 13.4  HCT 38.2   Recent Labs    09/20/19 1708  NA 132*  K 3.7  CL 98  CO2 23  GLUCOSE 143*  BUN 14  CREATININE 1.02*  CALCIUM 9.8   Recent Labs    09/20/19 1708  INR 1.0   No results for input(s): LABURIN in the last 72 hours. Results for orders placed or performed during the hospital encounter of 09/20/19  Respiratory Panel by RT PCR (Flu A&B, Covid) - Nasopharyngeal Swab     Status: None   Collection Time: 09/20/19  7:16 PM   Specimen: Nasopharyngeal Swab  Result Value Ref Range Status   SARS Coronavirus 2 by RT PCR NEGATIVE NEGATIVE Final    Comment: (NOTE) SARS-CoV-2 target nucleic acids are NOT DETECTED. The SARS-CoV-2 RNA is generally detectable in upper respiratoy specimens during the acute phase of infection. The lowest concentration of SARS-CoV-2 viral copies this assay can detect is 131 copies/mL. A negative result does not preclude SARS-Cov-2 infection and should not be used as the sole basis for treatment or other patient management decisions. A negative result may occur with  improper specimen collection/handling, submission of specimen other than nasopharyngeal swab, presence of viral mutation(s) within the areas targeted by this assay, and inadequate number of viral copies (<131 copies/mL). A negative result must be combined with clinical observations, patient history, and epidemiological information. The expected result is Negative. Fact Sheet for Patients:  PinkCheek.be Fact Sheet for Healthcare Providers:  GravelBags.it This test is not yet ap proved or cleared by the [place] FDA and  has been authorized for detection and/or diagnosis of  SARS-CoV-2 by FDA under an Emergency Use Authorization (EUA). This EUA will remain  in effect (meaning this test can be used) for the duration of the COVID-19 declaration under Section 564(b)(1) of the Act, 21 U.S.C. section 360bbb-3(b)(1), unless the authorization is terminated or revoked sooner.    Influenza A by PCR NEGATIVE NEGATIVE Final   Influenza B by PCR NEGATIVE NEGATIVE Final    Comment: (NOTE) The Xpert Xpress SARS-CoV-2/FLU/RSV assay is intended as an aid in  the diagnosis of influenza from Nasopharyngeal swab specimens and  should not be used as a sole basis  for treatment. Nasal washings and  aspirates are unacceptable for Xpert Xpress SARS-CoV-2/FLU/RSV  testing. Fact Sheet for Patients: PinkCheek.be Fact Sheet for Healthcare Providers: GravelBags.it This test is not yet approved or cleared by the [place] FDA and  has been authorized for detection and/or diagnosis of SARS-CoV-2 by  FDA under an Emergency Use Authorization (EUA). This EUA will remain  in effect (meaning this test can be used) for the duration of the  Covid-19 declaration under Section 564(b)(1) of the Act, 21  U.S.C. section 360bbb-3(b)(1), unless the authorization is  terminated or revoked. Performed at [facility], [address] [place], [place] [address]     Imaging: CT Abd/Pelvis IMPRESSION: 6 mm proximal left ureteral stone with hydronephrosis and hydroureter. Some mild inflammatory changes of the left kidney are noted as well.  Nonobstructing right renal stones as described.  Diverticulosis without diverticulitis.  A/P: 69 yo woman with 6 mm obstructing left ureteral calculus associated with infection.  -risks and benefits  of cystoscopy with left ureteral stent including but not limited to bleeding, infection, damage to surrounding structures, need for future intervention, pain, stent discomfort, inability  to place stent discussed with patient and informed consent obtained -urgent stent placement in OR tonight -will continue antibiotics  Thank you for involving me in this patient's care, I will continue to follow along. Please page with any further questions or concerns. Jaimy Kliethermes D Glen Kesinger

## 2019-09-20 NOTE — ED Provider Notes (Signed)
9:28 PM-she presents from [facility], transferred by Dr. Melina Copa to see Dr. Claudia Desanctis, from urology. She has an obstructing left proximal ureter stone, and evidence for urinary tract infection. Mild lactate elevation. She has been treated with sepsis dose boluses and IV antibiotics. On arrival here patient was not aware she was going the OR and requested to see Dr. Claudia Desanctis.  Patient states her pain is controlled at this time. Blood pressure elevated while at [facility]. Temperature elevated, improved with Tylenol.  Patient stable to go to the OR.   Daleen Bo, MD 09/20/19 2134

## 2019-09-20 NOTE — Interval H&P Note (Signed)
History and Physical Interval Note:  09/20/2019 9:51 PM  Hannah Nicholson  has presented today for surgery, with the diagnosis of ureteral obstruction.  The various methods of treatment have been discussed with the patient and family. After consideration of risks, benefits and other options for treatment, the patient has consented to  Procedure(s): CYSTOSCOPY WITH RETROGRADE PYELOGRAM/URETERAL STENT PLACEMENT (Left) as a surgical intervention.  The patient's history has been reviewed, patient examined, no change in status, stable for surgery.  I have reviewed the patient's chart and labs.  Questions were answered to the patient's satisfaction.     Odesser Tourangeau D Adryan Druckenmiller

## 2019-09-20 NOTE — Progress Notes (Signed)
Notified provider of need to order a third lactic acid due to second lactic results being higher than the first.  Margaret Pyle, RN

## 2019-09-20 NOTE — ED Notes (Signed)
Report given to Darryl RN at [facility]

## 2019-09-20 NOTE — ED Notes (Signed)
Report called to charge nurse at [facility]

## 2019-09-20 NOTE — ED Notes (Signed)
[facility] notified (Jaime) - patient ready for transport 

## 2019-09-20 NOTE — ED Triage Notes (Addendum)
Pt c/o left side abd pain and lower back pain, chills, constipation "feels like diverticulitis"-NAD-to triage in w/c

## 2019-09-20 NOTE — Op Note (Signed)
Preoperative diagnosis:  1. Left ureteral calculus with infection  Postoperative diagnosis:  1. Same  Procedure:  1. Cystoscopy 2. left ureteral stent placement 3. left retrograde pyelography with interpretation   Surgeon: Jacalyn Lefevre, MD  Anesthesia: General  Complications: None  Intraoperative findings:  left retrograde pyelography demonstrated a filling defect within the left ureter consistent with the patient's known calculus without other abnormalities  EBL: Minimal  Specimens: None  Indication: Hannah Nicholson is a 69 y.o. patient with 6 mm obstructing left ureteral calculus with fever and UTI. After reviewing the management options for treatment, he elected to proceed with the above surgical procedure(s). We have discussed the potential benefits and risks of the procedure, side effects of the proposed treatment, the likelihood of the patient achieving the goals of the procedure, and any potential problems that might occur during the procedure or recuperation. Informed consent has been obtained.  Description of procedure:  The patient was taken to the operating room and general anesthesia was induced.  The patient was placed in the dorsal lithotomy position, prepped and draped in the usual sterile fashion, and preoperative antibiotics were administered. A preoperative time-out was performed.   Cystourethroscopy was performed.  The patient's urethra was examined and was normal. The bladder was then systematically examined in its entirety. There was no evidence for any bladder tumors, stones, or other mucosal pathology.    Attention then turned to the leftureteral orifice and a ureteral catheter was used to intubate the ureteral orifice.  Omnipaque contrast was injected through the ureteral catheter and a retrograde pyelogram was performed with findings as dictated above.  Purulent, cloudy urine was seen effluxing as soon as wire intubated ureteral orifice.   A 0.38  sensor guidewire was then advanced up the left ureter into the renal pelvis under fluoroscopic guidance.  The wire was then backloaded through the cystoscope and a ureteral stent was advance over the wire using Seldinger technique.  The stent was positioned appropriately under fluoroscopic and cystoscopic guidance.  The wire was then removed with an adequate stent curl noted in the renal pelvis as well as in the bladder.  The bladder was then emptied and the procedure ended.  The patient appeared to tolerate the procedure well and without complications.  The patient was able to be awakened and transferred to the recovery unit in satisfactory condition.    Jacalyn Lefevre, M.D.

## 2019-09-20 NOTE — Anesthesia Preprocedure Evaluation (Addendum)
Anesthesia Evaluation  Patient identified by MRN, date of birth, ID band Patient awake    Reviewed: Allergy & Precautions, NPO status , Patient's Chart, lab work & pertinent test results  Airway Mallampati: II  TM Distance: >3 FB Neck ROM: Full    Dental no notable dental hx.    Pulmonary asthma ,    Pulmonary exam normal breath sounds clear to auscultation       Cardiovascular hypertension, Pt. on medications negative cardio ROS Normal cardiovascular exam Rhythm:Regular Rate:Normal     Neuro/Psych negative neurological ROS  negative psych ROS   GI/Hepatic Neg liver ROS, GERD  ,  Endo/Other  diabetesHypothyroidism   Renal/GU negative Renal ROS  negative genitourinary   Musculoskeletal negative musculoskeletal ROS (+)   Abdominal (+) + obese,   Peds negative pediatric ROS (+)  Hematology negative hematology ROS (+)   Anesthesia Other Findings   Reproductive/Obstetrics negative OB ROS                             Anesthesia Physical Anesthesia Plan  ASA: III and emergent  Anesthesia Plan: General   Post-op Pain Management:    Induction: Intravenous  PONV Risk Score and Plan: 3 and Ondansetron, Dexamethasone, Midazolam and Treatment may vary due to age or medical condition  Airway Management Planned: LMA  Additional Equipment:   Intra-op Plan:   Post-operative Plan: Extubation in OR  Informed Consent: I have reviewed the patients History and Physical, chart, labs and discussed the procedure including the risks, benefits and alternatives for the proposed anesthesia with the patient or authorized representative who has indicated his/her understanding and acceptance.     Dental advisory given  Plan Discussed with: CRNA  Anesthesia Plan Comments:         Anesthesia Quick Evaluation

## 2019-09-20 NOTE — ED Notes (Signed)
Pt to xr 

## 2019-09-20 NOTE — Anesthesia Procedure Notes (Signed)
Procedure Name: LMA Insertion Date/Time: 09/20/2019 10:15 PM Performed by: Montel Clock, CRNA Pre-anesthesia Checklist: Patient identified, Emergency Drugs available, Suction available, Patient being monitored and Timeout performed Patient Re-evaluated:Patient Re-evaluated prior to induction Oxygen Delivery Method: Circle system utilized Preoxygenation: Pre-oxygenation with 100% oxygen Induction Type: IV induction Ventilation: Mask ventilation without difficulty LMA: LMA with gastric port inserted LMA Size: 4.0 Number of attempts: 1 Dental Injury: Teeth and Oropharynx as per pre-operative assessment

## 2019-09-20 NOTE — Transfer of Care (Signed)
Immediate Anesthesia Transfer of Care Note  Patient: Hannah Nicholson  Procedure(s) Performed: CYSTOSCOPY WITH RETROGRADE PYELOGRAM/URETERAL STENT PLACEMENT (Left )  Patient Location: PACU  Anesthesia Type:General  Level of Consciousness: drowsy and patient cooperative  Airway & Oxygen Therapy: Patient Spontanous Breathing and Patient connected to face mask oxygen  Post-op Assessment: Report given to RN and Post -op Vital signs reviewed and stable  Post vital signs: Reviewed and stable  Last Vitals:  Vitals Value Taken Time  BP 134/76 09/20/19 2233  Temp    Pulse 97 09/20/19 2235  Resp 19 09/20/19 2235  SpO2 100 % 09/20/19 2235  Vitals shown include unvalidated device data.  Last Pain:  Vitals:   09/20/19 1938  TempSrc: Oral  PainSc:          Complications: No apparent anesthesia complications

## 2019-09-20 NOTE — Progress Notes (Signed)
Resume all hold med due to surg. Per Dr. Claudia Desanctis.

## 2019-09-20 NOTE — H&P (View-Only) (Signed)
I have been asked to see the patient by Dr. Danella Sensing, for evaluation and management of left ureteral stone with fever.  History of present illness: 69 yo woman presented to [facility] ED with fever to 103 and left flank pain.  CT abd/pelvis shows 6 mm proximal left ureteral calculus with hydro.  UA +nitrates, WBCs and RBCs.  Patient has never had a stone before.    Review of systems: A 12 point comprehensive review of systems was obtained and is negative unless otherwise stated in the history of present illness.  Patient Active Problem List   Diagnosis Date Noted  . 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 Conjunctivitis of both eyes 11/19/2010  . SARCOIDOSIS 09/06/2008  . Seasonal and perennial allergic rhinitis 09/06/2008  . Asthma with acute exacerbation 09/06/2008  . ESOPHAGEAL REFLUX 09/06/2008    No current facility-administered medications on file prior to encounter.   Current Outpatient Medications on File Prior to Encounter  Medication Sig Dispense Refill  . Cholecalciferol (VITAMIN D-3) 5000 UNITS TABS Take 1 tablet by mouth daily.    . fexofenadine (ALLEGRA) 180 MG tablet Take 180 mg by mouth daily.      . hydrochlorothiazide (HYDRODIURIL) 25 MG tablet Take 1 tablet by mouth daily.    Marland Kitchen HYDROcodone-acetaminophen (NORCO/VICODIN) 5-325 MG per tablet Take 1 tablet by mouth every 6 (six) hours as needed for moderate pain.    Marland Kitchen ibuprofen (ADVIL,MOTRIN) 600 MG tablet Take 1 tablet by mouth as needed.  0  . levothyroxine (SYNTHROID, LEVOTHROID) 25 MCG tablet Take 25 mcg by mouth daily before breakfast.    . metoprolol succinate (TOPROL-XL) 25 MG 24 hr tablet Take 1 tablet by mouth daily.    . Multiple Vitamin (MULTIVITAMIN) capsule Take 1 capsule by mouth daily.      Marland Kitchen telmisartan (MICARDIS) 80 MG tablet Take 1 tablet by mouth daily.      Past Medical History:  Diagnosis Date  . Allergic rhinitis, cause unspecified   . Anxiety   . Chronic  kidney disease    kidney stones  . Diabetes ([place])   . Diverticulitis   . Esophageal reflux   . Gastroesophageal reflux disease without esophagitis   . Gout   . History of colonic polyps   . HTN (hypertension)   . Hypothyroidism   . Low back pain   . Nontoxic single thyroid nodule   . Personal history of malignant neoplasm of breast   . Pure hypercholesterolemia, unspecified   . Sarcoidosis   . Sleep disturbance   . Stress reaction   . Unspecified asthma(493.90)   . Vaginal atrophy   . Vitamin D deficiency     Past Surgical History:  Procedure Laterality Date  . BREAST LUMPECTOMY Left    BREAST CANCER AND RADIATION, NO CHEMO  . CHOLECYSTECTOMY    . COLONOSCOPY     TUBULAR ADENOMA  . HEMORRHOID SURGERY    . LAPAROSCOPIC INGUINAL HERNIA WITH UMBILICAL HERNIA    . MASTECTOMY Left 2016  . sinus surgery    . TONSILLECTOMY    . TOTAL ABDOMINAL HYSTERECTOMY    . TUBAL LIGATION      Social History   Tobacco Use  . Smoking status: Never Smoker  . Smokeless tobacco: Never Used  Substance Use Topics  . Alcohol use: No  . Drug use: No    Family History  Problem Relation Age of Onset  . Asthma Father   . Heart  disease Mother   . Prostate cancer Brother 75  . Prostate cancer Maternal Uncle        dx 90s  . Lupus Sister   . Breast cancer Sister 27  . Stroke Sister 15  . Prostate cancer Maternal Uncle        dx 49s  . Lung cancer Cousin        thought to be due to sarcoidosis    PE: Vitals:   09/20/19 1929 09/20/19 1938 09/20/19 2000 09/20/19 2031  BP: (!) 164/98  (!) 161/78 (!) 188/96  Pulse: 99  87 90  Resp: (!) 25  (!) 22 (!) 21  Temp:  (!) 100.6 F (38.1 C)    TempSrc:  Oral    SpO2: 96%  93% 96%  Weight:      Height:       Patient appears to be in no acute distress  patient is alert and oriented x3 Atraumatic normocephalic head No cervical or supraclavicular lymphadenopathy appreciated No increased work of breathing Regular sinus  rhythm/rate Abdomen is soft, nontender, nondistended Lower extremities are symmetric without appreciable edema Grossly neurologically intact No identifiable skin lesions  Recent Labs    09/20/19 1708  WBC 8.6  HGB 13.4  HCT 38.2   Recent Labs    09/20/19 1708  NA 132*  K 3.7  CL 98  CO2 23  GLUCOSE 143*  BUN 14  CREATININE 1.02*  CALCIUM 9.8   Recent Labs    09/20/19 1708  INR 1.0   No results for input(s): LABURIN in the last 72 hours. Results for orders placed or performed during the hospital encounter of 09/20/19  Respiratory Panel by RT PCR (Flu A&B, Covid) - Nasopharyngeal Swab     Status: None   Collection Time: 09/20/19  7:16 PM   Specimen: Nasopharyngeal Swab  Result Value Ref Range Status   SARS Coronavirus 2 by RT PCR NEGATIVE NEGATIVE Final    Comment: (NOTE) SARS-CoV-2 target nucleic acids are NOT DETECTED. The SARS-CoV-2 RNA is generally detectable in upper respiratoy specimens during the acute phase of infection. The lowest concentration of SARS-CoV-2 viral copies this assay can detect is 131 copies/mL. A negative result does not preclude SARS-Cov-2 infection and should not be used as the sole basis for treatment or other patient management decisions. A negative result may occur with  improper specimen collection/handling, submission of specimen other than nasopharyngeal swab, presence of viral mutation(s) within the areas targeted by this assay, and inadequate number of viral copies (<131 copies/mL). A negative result must be combined with clinical observations, patient history, and epidemiological information. The expected result is Negative. Fact Sheet for Patients:  PinkCheek.be Fact Sheet for Healthcare Providers:  GravelBags.it This test is not yet ap proved or cleared by the [place] FDA and  has been authorized for detection and/or diagnosis of SARS-CoV-2 by FDA under an  Emergency Use Authorization (EUA). This EUA will remain  in effect (meaning this test can be used) for the duration of the COVID-19 declaration under Section 564(b)(1) of the Act, 21 U.S.C. section 360bbb-3(b)(1), unless the authorization is terminated or revoked sooner.    Influenza A by PCR NEGATIVE NEGATIVE Final   Influenza B by PCR NEGATIVE NEGATIVE Final    Comment: (NOTE) The Xpert Xpress SARS-CoV-2/FLU/RSV assay is intended as an aid in  the diagnosis of influenza from Nasopharyngeal swab specimens and  should not be used as a sole basis for treatment. Nasal washings and  aspirates are unacceptable for Xpert Xpress SARS-CoV-2/FLU/RSV  testing. Fact Sheet for Patients: PinkCheek.be Fact Sheet for Healthcare Providers: GravelBags.it This test is not yet approved or cleared by the [place] FDA and  has been authorized for detection and/or diagnosis of SARS-CoV-2 by  FDA under an Emergency Use Authorization (EUA). This EUA will remain  in effect (meaning this test can be used) for the duration of the  Covid-19 declaration under Section 564(b)(1) of the Act, 21  U.S.C. section 360bbb-3(b)(1), unless the authorization is  terminated or revoked. Performed at [facility], [address] [place], [place] [address]     Imaging: CT Abd/Pelvis IMPRESSION: 6 mm proximal left ureteral stone with hydronephrosis and hydroureter. Some mild inflammatory changes of the left kidney are noted as well.  Nonobstructing right renal stones as described.  Diverticulosis without diverticulitis.  A/P: 69 yo woman with 6 mm obstructing left ureteral calculus associated with infection.  -risks and benefits  of cystoscopy with left ureteral stent including but not limited to bleeding, infection, damage to surrounding structures, need for future intervention, pain, stent discomfort, inability to place stent discussed with  patient and informed consent obtained -urgent stent placement in OR tonight -will continue antibiotics  Thank you for involving me in this patient's care, I will continue to follow along. Please page with any further questions or concerns. Analicia Skibinski D Blaike Vickers

## 2019-09-20 NOTE — ED Provider Notes (Signed)
[facility]PART[facility] Provider Note   CSN: [id number] Arrival date & time: 09/20/19  1631     History Chief Complaint  Patient presents with  . Abdominal Pain    Hannah Nicholson is a 69 y.o. female.  She is complaining of some left lower quadrant pain and low back pain that started last evening.  Associated with fevers chills and constipation.  He has been progressively worse and now is 10 out of 10.  Has a headache has some cough nonproductive.  No nausea or vomiting.  No diarrhea.  No urinary symptoms.  Does have a prior history of diverticulitis and feels similar.  She has a history of allergy to IVP dye.  The history is provided by the patient.  Abdominal Pain Pain location:  LLQ Pain quality: aching   Pain radiates to:  Back Onset quality:  Gradual Duration:  2 days Timing:  Constant Progression:  Worsening Chronicity:  Recurrent Context: not recent travel, not sick contacts, not suspicious food intake and not trauma   Relieved by:  None tried Worsened by:  Movement Ineffective treatments:  None tried Associated symptoms: chills, constipation and fever   Associated symptoms: no chest pain, no cough, no diarrhea, no dysuria, no hematemesis, no hematochezia, no hematuria, no nausea, no shortness of breath, no sore throat, no vaginal bleeding, no vaginal discharge and no vomiting        Past Medical History:  Diagnosis Date  . Allergic rhinitis, cause unspecified   . Anxiety   . Chronic kidney disease    kidney stones  . Diabetes ([place])   . Diverticulitis   . Esophageal reflux   . Gastroesophageal reflux disease without esophagitis   . Gout   . History of colonic polyps   . HTN (hypertension)   . Hypothyroidism   . Low back pain   . Nontoxic single thyroid nodule   . Personal history of malignant neoplasm of breast   . Pure hypercholesterolemia, unspecified   . Sarcoidosis   . Sleep disturbance   . Stress reaction   . Unspecified  asthma(493.90)   . Vaginal atrophy   . Vitamin D deficiency     Patient Active Problem List   Diagnosis Date Noted  . 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 Conjunctivitis of both eyes 11/19/2010  . SARCOIDOSIS 09/06/2008  . Seasonal and perennial allergic rhinitis 09/06/2008  . Asthma with acute exacerbation 09/06/2008  . ESOPHAGEAL REFLUX 09/06/2008    Past Surgical History:  Procedure Laterality Date  . BREAST LUMPECTOMY Left    BREAST CANCER AND RADIATION, NO CHEMO  . CHOLECYSTECTOMY    . COLONOSCOPY     TUBULAR ADENOMA  . HEMORRHOID SURGERY    . LAPAROSCOPIC INGUINAL HERNIA WITH UMBILICAL HERNIA    . MASTECTOMY Left 2016  . sinus surgery    . TONSILLECTOMY    . TOTAL ABDOMINAL HYSTERECTOMY    . TUBAL LIGATION       OB History   No obstetric history on file.     Family History  Problem Relation Age of Onset  . Asthma Father   . Heart disease Mother   . Prostate cancer Brother 5  . Prostate cancer Maternal Uncle        dx 59s  . Lupus Sister   . Breast cancer Sister 45  . Stroke Sister 22  . Prostate cancer Maternal Uncle        dx 70s  .  Lung cancer Cousin        thought to be due to sarcoidosis    Social History   Tobacco Use  . Smoking status: Never Smoker  . Smokeless tobacco: Never Used  Substance Use Topics  . Alcohol use: No  . Drug use: No    Home Medications Prior to Admission medications   Medication Sig Start Date End Date Taking? Authorizing Provider  Cholecalciferol (VITAMIN D-3) 5000 UNITS TABS Take 1 tablet by mouth daily.    [provider]  fexofenadine (ALLEGRA) 180 MG tablet Take 180 mg by mouth daily.      [provider]  hydrochlorothiazide (HYDRODIURIL) 25 MG tablet Take 1 tablet by mouth daily. 02/28/15   [provider]  HYDROcodone-acetaminophen (NORCO/VICODIN) 5-325 MG per tablet Take 1 tablet by mouth every 6 (six) hours as needed for moderate pain.    [provider]  ibuprofen (ADVIL,MOTRIN) 600 MG tablet Take 1 tablet by mouth as needed. 03/17/18   [provider]  levothyroxine (SYNTHROID, LEVOTHROID) 25 MCG tablet Take 25 mcg by mouth daily before breakfast.    [provider]  metoprolol succinate (TOPROL-XL) 25 MG 24 hr tablet Take 1 tablet by mouth daily. 03/06/15   [provider]  Multiple Vitamin (MULTIVITAMIN) capsule Take 1 capsule by mouth daily.      [provider]  telmisartan (MICARDIS) 80 MG tablet Take 1 tablet by mouth daily. 04/15/18   [provider]    Allergies    Iodine, Ivp dye [iodinated diagnostic agents], Amlodipine besylate, Hydrochlorothiazide, and Lisinopril  Review of Systems   Review of Systems  Constitutional: Positive for chills and fever.  HENT: Negative for sore throat.   Eyes: Negative for visual disturbance.  Respiratory: Negative for cough and shortness of breath.   Cardiovascular: Negative for chest pain.  Gastrointestinal: Positive for abdominal pain and constipation. Negative for diarrhea, hematemesis, hematochezia, nausea and vomiting.  Genitourinary: Negative for dysuria, hematuria, vaginal bleeding and vaginal discharge.  Musculoskeletal: Positive for back pain.  Skin: Negative for rash.  Neurological: Positive for headaches.    Physical Exam Updated Vital Signs BP (!) 162/85 (BP Location: Right Arm)   Pulse 95   Temp (!) 103 F (39.4 C) (Oral)   Resp 20   Ht 5\' 3"  (1.6 m)   Wt 83.9 kg   SpO2 96%   BMI 32.77 kg/m   Physical Exam Vitals and nursing note reviewed.  Constitutional:      General: She is not in acute distress.    Appearance: Normal appearance. She is well-developed.  HENT:     Head: Normocephalic and atraumatic.  Eyes:     Conjunctiva/sclera: Conjunctivae normal.  Cardiovascular:     Rate and Rhythm: Normal rate and regular rhythm.     Heart sounds: No murmur.  Pulmonary:     Effort: Pulmonary effort is normal.  No respiratory distress.     Breath sounds: Normal breath sounds.  Abdominal:     Palpations: Abdomen is soft.     Tenderness: There is abdominal tenderness in the left lower quadrant. There is no guarding or rebound.  Musculoskeletal:        General: No deformity or signs of injury. Normal range of motion.     Cervical back: Neck supple.  Skin:    General: Skin is warm and dry.     Capillary Refill: Capillary refill takes less than 2 seconds.  Neurological:     General: No  focal deficit present.     Mental Status: She is alert.     Gait: Gait normal.     ED Results / Procedures / Treatments   Labs (all labs ordered are listed, but only abnormal results are displayed) Labs Reviewed  LACTIC ACID, PLASMA - Abnormal; Notable for the following components:      Result Value   Lactic Acid, Venous 2.0 (*)    All other components within normal limits  LACTIC ACID, PLASMA - Abnormal; Notable for the following components:   Lactic Acid, Venous 2.7 (*)    All other components within normal limits  COMPREHENSIVE METABOLIC PANEL - Abnormal; Notable for the following components:   Sodium 132 (*)    Glucose, Bld 143 (*)    Creatinine, Ser 1.02 (*)    GFR calc non Af Amer 57 (*)    All other components within normal limits  URINALYSIS, ROUTINE W REFLEX MICROSCOPIC - Abnormal; Notable for the following components:   Specific Gravity, Urine <1.005 (*)    Hgb urine dipstick SMALL (*)    Nitrite POSITIVE (*)    Leukocytes,Ua SMALL (*)    All other components within normal limits  URINALYSIS, MICROSCOPIC (REFLEX) - Abnormal; Notable for the following components:   Bacteria, UA FEW (*)    All other components within normal limits  GLUCOSE, CAPILLARY - Abnormal; Notable for the following components:   Glucose-Capillary 160 (*)    All other components within normal limits  CULTURE, BLOOD (ROUTINE X 2)  CULTURE, BLOOD (ROUTINE X 2)  RESPIRATORY PANEL BY RT PCR (FLU A&B, COVID)  URINE CULTURE   BLOOD CULTURE ID PANEL (REFLEXED)  CBC WITH DIFFERENTIAL/PLATELET  APTT  PROTIME-INR  LIPASE, BLOOD  LACTIC ACID, PLASMA    EKG EKG Interpretation  Date/Time:  Monday September 20 2019 17:28:16 EDT Ventricular Rate:  93 PR Interval:    QRS Duration: 81 QT Interval:  330 QTC Calculation: 411 R Axis:   40 Text Interpretation: Sinus rhythm Borderline T abnormalities, anterior leads No significant change since prior 10/11 Confirmed by Aletta Edouard [phone]) on 09/20/2019 5:35:47 PM   Radiology CT Abdomen Pelvis Wo Contrast  Result Date: 09/20/2019 CLINICAL DATA:  Abdominal pain on the left with chills, initial encounter EXAM: CT ABDOMEN AND PELVIS WITHOUT CONTRAST TECHNIQUE: Multidetector CT imaging of the abdomen and pelvis was performed following the standard protocol without IV contrast. COMPARISON:  07/17/2013 FINDINGS: Lower chest: Lung bases are free of acute infiltrate or sizable effusion. Changes consistent with prior left mastectomy are noted new from the prior exam. Hepatobiliary: Fatty infiltration of the liver is noted. The gallbladder has been surgically removed. Pancreas: Unremarkable. No pancreatic ductal dilatation or surrounding inflammatory changes. Spleen: Normal in size without focal abnormality. Adrenals/Urinary Tract: Adrenal glands are within normal limits. Kidneys are well visualized bilaterally. Nonobstructing renal stones are seen on the right measuring approximately 6 mm in dimension. No obstructive changes are seen. The right ureter is within normal limits. Left kidney demonstrates evidence of hydronephrosis and proximal hydroureter secondary to a 6 mm stone best seen on image number 38 of series 2 and image number 56 of series 5. The more distal left ureter is within normal limits. The bladder is well distended. Stomach/Bowel: Diverticular change of the colon is noted without evidence of diverticulitis. No obstructive changes of the colon are seen. The appendix is  within normal limits. Small bowel and stomach are unremarkable. Vascular/Lymphatic: Aortic atherosclerosis. No enlarged abdominal or pelvic lymph nodes. Multiple  ovarian vein phleboliths are noted bilaterally. Reproductive: Status post hysterectomy. No adnexal masses. Other: No abdominal wall hernia or abnormality. No abdominopelvic ascites. Musculoskeletal: Degenerative changes of lumbar spine are noted. No acute bony abnormality is seen. IMPRESSION: 6 mm proximal left ureteral stone with hydronephrosis and hydroureter. Some mild inflammatory changes of the left kidney are noted as well. Nonobstructing right renal stones as described. Diverticulosis without diverticulitis. Electronically Signed   By: Inez Catalina M.D.   On: 09/20/2019 18:32   DG Chest Port 1 View  Result Date: 09/20/2019 CLINICAL DATA:  Fever. Left-sided abdominal and low back pain. EXAM: PORTABLE CHEST 1 VIEW COMPARISON:  Radiograph 03/05/2010 FINDINGS: The cardiomediastinal contours are normal. Patchy right infrahilar opacity. Lingular scarring unchanged. Pulmonary vasculature is normal. No pleural effusion or pneumothorax. No acute osseous abnormalities are seen. IMPRESSION: Patchy right infrahilar opacity, may represent atelectasis or pneumonia in the setting of fever. Electronically Signed   By: Keith Rake M.D.   On: 09/20/2019 17:51   DG C-Arm 1-60 Min-No Report  Result Date: 09/20/2019 Fluoroscopy was utilized by the requesting physician.  No radiographic interpretation.    Procedures .Critical Care Performed by: Hayden Rasmussen, MD Authorized by: Hayden Rasmussen, MD   Critical care provider statement:    Critical care time (minutes):  45   Critical care time was exclusive of:  Separately billable procedures and treating other patients   Critical care was necessary to treat or prevent imminent or life-threatening deterioration of the following conditions: SIRS.   Critical care was time spent personally by me on  the following activities:  Discussions with consultants, evaluation of patient's response to treatment, examination of patient, ordering and performing treatments and interventions, ordering and review of laboratory studies, ordering and review of radiographic studies, pulse oximetry, re-evaluation of patient's condition, obtaining history from patient or surrogate, review of old charts and development of treatment plan with patient or surrogate   I assumed direction of critical care for this patient from another provider in my specialty: no     (including critical care time)  Medications Ordered in ED Medications  sodium chloride flush (NS) 0.9 % injection 3 mL (has no administration in time range)  acetaminophen (TYLENOL) 325 MG tablet (has no administration in time range)  sodium chloride 0.9 % bolus 1,000 mL (has no administration in time range)    And  sodium chloride 0.9 % bolus 1,000 mL (has no administration in time range)    And  sodium chloride 0.9 % bolus 1,000 mL (has no administration in time range)  cefTRIAXone (ROCEPHIN) 2 g in sodium chloride 0.9 % 100 mL IVPB (has no administration in time range)  metroNIDAZOLE (FLAGYL) IVPB 500 mg (has no administration in time range)  sodium chloride 0.9 % with cefTRIAXone (ROCEPHIN) ADS Med (has no administration in time range)  morphine 4 MG/ML injection (has no administration in time range)  ondansetron (ZOFRAN) 4 MG/2ML injection (has no administration in time range)  acetaminophen (TYLENOL) tablet 650 mg (650 mg Oral Given 09/20/19 1711)    ED Course  I have reviewed the triage vital signs and the nursing notes.  Pertinent labs & imaging results that were available during my care of the patient were reviewed by me and considered in my medical decision making (see chart for details).  Clinical Course as of Sep 21 943  Mon Sep 20, 2019  1749 Here has normal white count and minimally elevated lactic acid of 2.0.  Covering empiric  antibiotics for abdominal source with ceftriaxone and Flagyl.   [MB]  1940 Discussed with Dr. Claudia Desanctis from urology.  She asked if the patient to be transferred ED to ED at [facility] while she will evaluate her.   [MB]  1950 Discussed with Dr. Darl Householder ED physician at [facility] who accepts the patient in transfer.   [MB]  Tue Sep 21, 2019  0943 WBC: 8.6 [MB]    Clinical Course User Index [MB] Hayden Rasmussen, MD   MDM Rules/Calculators/A&P                     This patient complains of abdominal and back pain and fever; this involves an extensive number of treatment Options and is a complaint that carries with it a high risk of complications and Morbidity. The differential includes diverticulitis, abscess, colitis, infected renal stone, pyelonephritis  I ordered, reviewed and interpreted labs, which included normal white count normal hemoglobin essentially normal renal function, urinalysis showing possible UTI with 11-20 whites nitrite positive I ordered medication 30 cc/kg sepsis fluids along with ceftriaxone and Flagyl and pain medication I ordered imaging studies which included CT abdomen pelvis and I independently    visualized and interpreted imaging which showed proximal left ureteral stone Previous records obtained and reviewed in epic I consulted Dr. Claudia Desanctis urology and discussed lab and imaging findings  Critical Interventions: Treatment of SIRS with fluid and early antibiotics, recognition of possible infected stone and transfer patient to where she could undergo her procedure  After the interventions stated above, I reevaluated the patient and found him to be improved.  Unfortunately lactate rising.  Vital signs have improved.  We discussed the need for transfer and possible operative intervention.  Patient is agreeable to transfer.  Covid testing obtained  Final Clinical Impression(s) / ED Diagnoses Final diagnoses:  SIRS (systemic inflammatory response syndrome) ([place])    Ureterolithiasis  Pyelonephritis    Rx / DC Orders ED Discharge Orders    None       Hayden Rasmussen, MD 09/21/19 [phone]

## 2019-09-21 DIAGNOSIS — Z79899 Other long term (current) drug therapy: Secondary | ICD-10-CM | POA: Diagnosis not present

## 2019-09-21 DIAGNOSIS — A4151 Sepsis due to Escherichia coli [E. coli]: Secondary | ICD-10-CM | POA: Diagnosis not present

## 2019-09-21 DIAGNOSIS — K219 Gastro-esophageal reflux disease without esophagitis: Secondary | ICD-10-CM | POA: Diagnosis not present

## 2019-09-21 DIAGNOSIS — R8271 Bacteriuria: Secondary | ICD-10-CM | POA: Diagnosis not present

## 2019-09-21 DIAGNOSIS — E039 Hypothyroidism, unspecified: Secondary | ICD-10-CM | POA: Diagnosis present

## 2019-09-21 DIAGNOSIS — Z90710 Acquired absence of both cervix and uterus: Secondary | ICD-10-CM | POA: Diagnosis not present

## 2019-09-21 DIAGNOSIS — B9620 Unspecified Escherichia coli [E. coli] as the cause of diseases classified elsewhere: Secondary | ICD-10-CM | POA: Diagnosis present

## 2019-09-21 DIAGNOSIS — Z853 Personal history of malignant neoplasm of breast: Secondary | ICD-10-CM | POA: Diagnosis not present

## 2019-09-21 DIAGNOSIS — E7800 Pure hypercholesterolemia, unspecified: Secondary | ICD-10-CM | POA: Diagnosis present

## 2019-09-21 DIAGNOSIS — N132 Hydronephrosis with renal and ureteral calculous obstruction: Secondary | ICD-10-CM | POA: Diagnosis not present

## 2019-09-21 DIAGNOSIS — Z79890 Hormone replacement therapy (postmenopausal): Secondary | ICD-10-CM | POA: Diagnosis not present

## 2019-09-21 DIAGNOSIS — I10 Essential (primary) hypertension: Secondary | ICD-10-CM | POA: Diagnosis present

## 2019-09-21 DIAGNOSIS — Z91041 Radiographic dye allergy status: Secondary | ICD-10-CM | POA: Diagnosis not present

## 2019-09-21 DIAGNOSIS — Z9012 Acquired absence of left breast and nipple: Secondary | ICD-10-CM | POA: Diagnosis not present

## 2019-09-21 DIAGNOSIS — J45909 Unspecified asthma, uncomplicated: Secondary | ICD-10-CM | POA: Diagnosis present

## 2019-09-21 DIAGNOSIS — K5900 Constipation, unspecified: Secondary | ICD-10-CM | POA: Diagnosis not present

## 2019-09-21 DIAGNOSIS — E119 Type 2 diabetes mellitus without complications: Secondary | ICD-10-CM | POA: Diagnosis present

## 2019-09-21 DIAGNOSIS — Z803 Family history of malignant neoplasm of breast: Secondary | ICD-10-CM | POA: Diagnosis not present

## 2019-09-21 DIAGNOSIS — Z20822 Contact with and (suspected) exposure to covid-19: Secondary | ICD-10-CM | POA: Diagnosis not present

## 2019-09-21 DIAGNOSIS — Z801 Family history of malignant neoplasm of trachea, bronchus and lung: Secondary | ICD-10-CM | POA: Diagnosis not present

## 2019-09-21 DIAGNOSIS — N136 Pyonephrosis: Secondary | ICD-10-CM | POA: Diagnosis not present

## 2019-09-21 DIAGNOSIS — M109 Gout, unspecified: Secondary | ICD-10-CM | POA: Diagnosis present

## 2019-09-21 DIAGNOSIS — K5790 Diverticulosis of intestine, part unspecified, without perforation or abscess without bleeding: Secondary | ICD-10-CM | POA: Diagnosis present

## 2019-09-21 DIAGNOSIS — Z832 Family history of diseases of the blood and blood-forming organs and certain disorders involving the immune mechanism: Secondary | ICD-10-CM | POA: Diagnosis not present

## 2019-09-21 LAB — BLOOD CULTURE ID PANEL (REFLEXED)

## 2019-09-21 LAB — GLUCOSE, CAPILLARY: Glucose-Capillary: 211 mg/dL — ABNORMAL HIGH (ref 70–99)

## 2019-09-21 LAB — LACTIC ACID, PLASMA: Lactic Acid, Venous: 1.5 mmol/L (ref 0.5–1.9)

## 2019-09-21 MED ORDER — SENNA 8.6 MG PO TABS
1.0000 | ORAL_TABLET | Freq: Every day | ORAL | Status: DC
  Administered 2019-09-21 – 2019-09-23 (×3): 8.6 mg via ORAL
  Filled 2019-09-21 (×3): qty 1

## 2019-09-21 MED ORDER — POLYETHYLENE GLYCOL 3350 17 G PO PACK
17.0000 g | PACK | Freq: Every day | ORAL | Status: DC
  Administered 2019-09-22 – 2019-09-23 (×2): 17 g via ORAL
  Filled 2019-09-21 (×2): qty 1

## 2019-09-21 MED ORDER — SODIUM CHLORIDE 0.9 % IV SOLN
2.0000 g | INTRAVENOUS | Status: DC
  Administered 2019-09-21 – 2019-09-22 (×2): 2 g via INTRAVENOUS
  Filled 2019-09-21: qty 2
  Filled 2019-09-21: qty 20
  Filled 2019-09-21: qty 2

## 2019-09-21 MED ORDER — CEPHALEXIN 500 MG PO CAPS
500.0000 mg | ORAL_CAPSULE | Freq: Four times a day (QID) | ORAL | Status: DC
  Administered 2019-09-21: 500 mg via ORAL
  Filled 2019-09-21: qty 1

## 2019-09-21 NOTE — Discharge Summary (Signed)
Date of admission: 09/20/2019  Date of discharge: 09/23/19  Admission diagnosis: left ureteral calculus with UTI  Discharge diagnosis: same  Secondary diagnoses:  Patient Active Problem List   Diagnosis Date Noted  . Left ureteral calculus 09/20/2019  . 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 Conjunctivitis of both eyes 11/19/2010  . SARCOIDOSIS 09/06/2008  . Seasonal and perennial allergic rhinitis 09/06/2008  . Asthma with acute exacerbation 09/06/2008  . ESOPHAGEAL REFLUX 09/06/2008    Procedures performed: Procedure(s): CYSTOSCOPY WITH RETROGRADE PYELOGRAM/URETERAL STENT PLACEMENT  History and Physical: For full details, please see admission history and physical. Briefly, Hannah Nicholson is a 69 y.o. year old patient with 6 mm obstructing left ureteral calculus with UTI s/p left ureteral stent placement.   Hospital Course: Patient tolerated the procedure well.  She was then transferred to the floor after an uneventful PACU stay.  Her hospital course was uncomplicated.  On POD#3 she had met discharge criteria: was eating a regular diet, was up and ambulating independently,  pain was well controlled, and was ready to for discharge.   Laboratory values:  Recent Labs    09/20/19 1708  WBC 8.6  HGB 13.4  HCT 38.2   Recent Labs    09/20/19 1708  NA 132*  K 3.7  CL 98  CO2 23  GLUCOSE 143*  BUN 14  CREATININE 1.02*  CALCIUM 9.8   Recent Labs    09/20/19 1708  INR 1.0   No results for input(s): LABURIN in the last 72 hours. Results for orders placed or performed during the hospital encounter of 09/20/19  Blood Culture (routine x 2)     Status: None (Preliminary result)   Collection Time: 09/20/19  5:09 PM   Specimen: BLOOD  Result Value Ref Range Status   Specimen Description   Final    BLOOD BLOOD RIGHT ARM Performed at [facility], [address] [place], [place] [address]    Special Requests   Final    BOTTLES  DRAWN AEROBIC AND ANAEROBIC Blood Culture adequate volume Performed at [facility], [address] [place], [place] [address]    Culture   Final    NO GROWTH < 12 HOURS Performed at [facility], [place] [address] [place], [place] [address]    Report Status PENDING  Incomplete  Blood Culture (routine x 2)     Status: None (Preliminary result)   Collection Time: 09/20/19  5:20 PM   Specimen: BLOOD  Result Value Ref Range Status   Specimen Description   Final    BLOOD BLOOD RIGHT HAND Performed at [facility], [address] [place], [place] [address]    Special Requests   Final    BOTTLES DRAWN AEROBIC AND ANAEROBIC Blood Culture adequate volume Performed at [facility], [address] [place], [place] [address]    Culture   Final    NO GROWTH < 12 HOURS Performed at [facility], [place] [address], [place], [place] [address]    Report Status PENDING  Incomplete  Respiratory Panel by RT PCR (Flu A&B, Covid) - Nasopharyngeal Swab     Status: None   Collection Time: 09/20/19  7:16 PM   Specimen: Nasopharyngeal Swab  Result Value Ref Range Status   SARS Coronavirus 2 by RT PCR NEGATIVE NEGATIVE Final    Comment: (NOTE) SARS-CoV-2 target nucleic acids are NOT DETECTED.  The SARS-CoV-2 RNA is generally detectable in upper respiratoy specimens during the acute phase of infection. The lowest concentration of SARS-CoV-2 viral copies this assay can detect is 131 copies/mL. A negative result does not preclude SARS-Cov-2 infection and should not be used as the sole basis for treatment or other patient management decisions. A negative result may occur with  improper specimen collection/handling, submission of specimen other than nasopharyngeal swab, presence of viral mutation(s) within the areas targeted by this assay, and inadequate number of viral copies (<131 copies/mL). A negative result must be combined with clinical observations, patient  history, and epidemiological information. The expected result is Negative. Fact Sheet for Patients:  PinkCheek.be Fact Sheet for Healthcare Providers:  GravelBags.it This test is not yet ap proved or cleared by the [place] FDA and  has been authorized for detection and/or diagnosis of SARS-CoV-2 by FDA under an Emergency Use Authorization (EUA). This EUA will remain  in effect (meaning this test can be used) for the duration of the COVID-19 declaration under Section 564(b)(1) of the Act, 21 U.S.C. section 360bbb-3(b)(1), unless the authorization is terminated or revoked sooner.    Influenza A by PCR NEGATIVE NEGATIVE Final   Influenza B by PCR NEGATIVE NEGATIVE Final    Comment: (NOTE) The Xpert Xpress SARS-CoV-2/FLU/RSV assay is intended as an aid in  the diagnosis of influenza from Nasopharyngeal swab specimens and  should not be used as a sole basis for treatment. Nasal washings and  aspirates are unacceptable for Xpert Xpress SARS-CoV-2/FLU/RSV  testing. Fact Sheet for Patients: PinkCheek.be Fact Sheet for Healthcare Providers: GravelBags.it This test is not yet approved or cleared by the [place] FDA and  has been authorized for detection and/or diagnosis of SARS-CoV-2 by  FDA under an Emergency Use Authorization (EUA). This EUA will remain  in effect (meaning this test can be used) for the duration of the  Covid-19 declaration under Section 564(b)(1) of the Act, 21  U.S.C. section 360bbb-3(b)(1), unless the authorization is  terminated or revoked. Performed at [facility], [address] [place], [place] [address]     Disposition: Home  Discharge instruction: The patient was instructed to be ambulatory but told to refrain from heavy lifting, strenuous activity, or driving.   Discharge medications:  Patient blood cultures show e  colic bacteremia sensitive to bactrim.  She was transitioned to bactrim and discharged on pod3.   Followup:  Follow-up Information    [facility].   Contact information: [place] [phone]       Follow up On 09/28/2019.   Why: 3:30pm with Dr. Claudia Desanctis

## 2019-09-21 NOTE — Progress Notes (Signed)
Urology Inpatient Progress Report  Left ureteral calculus [N20.1]  Procedure(s): CYSTOSCOPY WITH RETROGRADE PYELOGRAM/URETERAL STENT PLACEMENT  1 Day Post-Op   Intv/Subj: No acute events overnight. Patient feels much better this morning.  Pain has resolved.  No fevers after stent has been placed.   Active Problems:   Left ureteral calculus  Current Facility-Administered Medications  Medication Dose Route Frequency Provider Last Rate Last Admin  . acetaminophen (TYLENOL) tablet 650 mg  650 mg Oral Q4H PRN Jacalyn Lefevre D, MD      . diphenhydrAMINE (BENADRYL) injection 12.5 mg  12.5 mg Intravenous Q6H PRN Jacalyn Lefevre D, MD       Or  . diphenhydrAMINE (BENADRYL) 12.5 MG/5ML elixir 12.5 mg  12.5 mg Oral Q6H PRN Jacalyn Lefevre D, MD      . docusate sodium (COLACE) capsule 100 mg  100 mg Oral BID Jacalyn Lefevre D, MD      . HYDROmorphone (DILAUDID) 1 MG/ML injection           . levothyroxine (SYNTHROID) tablet 50 mcg  50 mcg Oral Q0600 Robley Fries, MD   50 mcg at 09/21/19 0553  . loratadine (CLARITIN) tablet 10 mg  10 mg Oral Daily Jacalyn Lefevre D, MD      . metoprolol succinate (TOPROL-XL) 24 hr tablet 25 mg  25 mg Oral Daily Bryceton Hantz D, MD      . morphine 2 MG/ML injection 2-4 mg  2-4 mg Intravenous Q2H PRN Jacalyn Lefevre D, MD      . ondansetron (ZOFRAN) injection 4 mg  4 mg Intravenous Q4H PRN Jacalyn Lefevre D, MD      . oxybutynin (DITROPAN) tablet 5 mg  5 mg Oral Q8H PRN Jacalyn Lefevre D, MD      . oxyCODONE (Oxy IR/ROXICODONE) immediate release tablet 5 mg  5 mg Oral Q4H PRN Jacalyn Lefevre D, MD   5 mg at 09/21/19 0553  . sertraline (ZOLOFT) tablet 50 mg  50 mg Oral Daily Thera Basden, Simone Curia D, MD      . sodium chloride flush (NS) 0.9 % injection 3 mL  3 mL Intravenous Once Hayden Rasmussen, MD      . tiZANidine (ZANAFLEX) tablet 2-4 mg  2-4 mg Oral TID PRN Jacalyn Lefevre D, MD         Objective: Vital: Vitals:   09/20/19 2345 09/20/19 2351 09/21/19  0027 09/21/19 0630  BP: 124/73 127/77 129/69 110/66  Pulse: 88 99  70  Resp: 17 20  18   Temp:  98.1 F (36.7 C) 98.9 F (37.2 C) 98.2 F (36.8 C)  TempSrc:   Oral Oral  SpO2: 95% 96% 98% 96%  Weight:      Height:       I/Os: I/O last 3 completed shifts: In: [id number] [P.O.:720; I.V.:400; IV Piggyback:3000] Out: 1550 [Urine:1550]  Physical Exam:  General: Patient is in no apparent distress Lungs: Normal respiratory effort, chest expands symmetrically. GI: The abdomen is soft and nontender without mass. Ext: lower extremities symmetric  Lab Results: Recent Labs    09/20/19 1708  WBC 8.6  HGB 13.4  HCT 38.2   Recent Labs    09/20/19 1708  NA 132*  K 3.7  CL 98  CO2 23  GLUCOSE 143*  BUN 14  CREATININE 1.02*  CALCIUM 9.8   Recent Labs    09/20/19 1708  INR 1.0   No results for input(s): LABURIN in the last 72 hours. Results for orders placed or  performed during the hospital encounter of 09/20/19  Blood Culture (routine x 2)     Status: None (Preliminary result)   Collection Time: 09/20/19  5:09 PM   Specimen: BLOOD  Result Value Ref Range Status   Specimen Description   Final    BLOOD BLOOD RIGHT ARM Performed at [facility], [address] [place], [place] [address]    Special Requests   Final    BOTTLES DRAWN AEROBIC AND ANAEROBIC Blood Culture adequate volume Performed at [facility], [address] [place], [place] [address]    Culture   Final    NO GROWTH < 12 HOURS Performed at [facility], [place] [address], [place], [place] [address]    Report Status PENDING  Incomplete  Blood Culture (routine x 2)     Status: None (Preliminary result)   Collection Time: 09/20/19  5:20 PM   Specimen: BLOOD  Result Value Ref Range Status   Specimen Description   Final    BLOOD BLOOD RIGHT HAND Performed at [facility], [address] [place], [place] [address]    Special Requests   Final    BOTTLES DRAWN AEROBIC AND  ANAEROBIC Blood Culture adequate volume Performed at [facility], [address] [place], [place] [address]    Culture   Final    NO GROWTH < 12 HOURS Performed at [facility], [place] [address] [place], [place] [address]    Report Status PENDING  Incomplete  Respiratory Panel by RT PCR (Flu A&B, Covid) - Nasopharyngeal Swab     Status: None   Collection Time: 09/20/19  7:16 PM   Specimen: Nasopharyngeal Swab  Result Value Ref Range Status   SARS Coronavirus 2 by RT PCR NEGATIVE NEGATIVE Final    Comment: (NOTE) SARS-CoV-2 target nucleic acids are NOT DETECTED. The SARS-CoV-2 RNA is generally detectable in upper respiratoy specimens during the acute phase of infection. The lowest concentration of SARS-CoV-2 viral copies this assay can detect is 131 copies/mL. A negative result does not preclude SARS-Cov-2 infection and should not be used as the sole basis for treatment or other patient management decisions. A negative result may occur with  improper specimen collection/handling, submission of specimen other than nasopharyngeal swab, presence of viral mutation(s) within the areas targeted by this assay, and inadequate number of viral copies (<131 copies/mL). A negative result must be combined with clinical observations, patient history, and epidemiological information. The expected result is Negative. Fact Sheet for Patients:  PinkCheek.be Fact Sheet for Healthcare Providers:  GravelBags.it This test is not yet ap proved or cleared by the [place] FDA and  has been authorized for detection and/or diagnosis of SARS-CoV-2 by FDA under an Emergency Use Authorization (EUA). This EUA will remain  in effect (meaning this test can be used) for the duration of the COVID-19 declaration under Section 564(b)(1) of the Act, 21 U.S.C. section 360bbb-3(b)(1), unless the authorization is terminated or revoked  sooner.    Influenza A by PCR NEGATIVE NEGATIVE Final   Influenza B by PCR NEGATIVE NEGATIVE Final    Comment: (NOTE) The Xpert Xpress SARS-CoV-2/FLU/RSV assay is intended as an aid in  the diagnosis of influenza from Nasopharyngeal swab specimens and  should not be used as a sole basis for treatment. Nasal washings and  aspirates are unacceptable for Xpert Xpress SARS-CoV-2/FLU/RSV  testing. Fact Sheet for Patients: PinkCheek.be Fact Sheet for Healthcare  Providers: GravelBags.it This test is not yet approved or cleared by the [place] and  has been authorized for detection and/or diagnosis of SARS-CoV-2 by  FDA under an Emergency Use Authorization (EUA). This EUA will remain  in effect (meaning this test can be used) for the duration of the  Covid-19 declaration under Section 564(b)(1) of the Act, 21  U.S.C. section 360bbb-3(b)(1), unless the authorization is  terminated or revoked. Performed at [facility], [address] [place], [place] [address]     Studies/Results: CT Abdomen Pelvis Wo Contrast  Result Date: 09/20/2019 CLINICAL DATA:  Abdominal pain on the left with chills, initial encounter EXAM: CT ABDOMEN AND PELVIS WITHOUT CONTRAST TECHNIQUE: Multidetector CT imaging of the abdomen and pelvis was performed following the standard protocol without IV contrast. COMPARISON:  07/17/2013 FINDINGS: Lower chest: Lung bases are free of acute infiltrate or sizable effusion. Changes consistent with prior left mastectomy are noted new from the prior exam. Hepatobiliary: Fatty infiltration of the liver is noted. The gallbladder has been surgically removed. Pancreas: Unremarkable. No pancreatic ductal dilatation or surrounding inflammatory changes. Spleen: Normal in size without focal abnormality. Adrenals/Urinary Tract: Adrenal glands are within normal limits. Kidneys are well visualized bilaterally.  Nonobstructing renal stones are seen on the right measuring approximately 6 mm in dimension. No obstructive changes are seen. The right ureter is within normal limits. Left kidney demonstrates evidence of hydronephrosis and proximal hydroureter secondary to a 6 mm stone best seen on image number 38 of series 2 and image number 56 of series 5. The more distal left ureter is within normal limits. The bladder is well distended. Stomach/Bowel: Diverticular change of the colon is noted without evidence of diverticulitis. No obstructive changes of the colon are seen. The appendix is within normal limits. Small bowel and stomach are unremarkable. Vascular/Lymphatic: Aortic atherosclerosis. No enlarged abdominal or pelvic lymph nodes. Multiple ovarian vein phleboliths are noted bilaterally. Reproductive: Status post hysterectomy. No adnexal masses. Other: No abdominal wall hernia or abnormality. No abdominopelvic ascites. Musculoskeletal: Degenerative changes of lumbar spine are noted. No acute bony abnormality is seen. IMPRESSION: 6 mm proximal left ureteral stone with hydronephrosis and hydroureter. Some mild inflammatory changes of the left kidney are noted as well. Nonobstructing right renal stones as described. Diverticulosis without diverticulitis. Electronically Signed   By: Inez Catalina M.D.   On: 09/20/2019 18:32   DG Chest Port 1 View  Result Date: 09/20/2019 CLINICAL DATA:  Fever. Left-sided abdominal and low back pain. EXAM: PORTABLE CHEST 1 VIEW COMPARISON:  Radiograph 03/05/2010 FINDINGS: The cardiomediastinal contours are normal. Patchy right infrahilar opacity. Lingular scarring unchanged. Pulmonary vasculature is normal. No pleural effusion or pneumothorax. No acute osseous abnormalities are seen. IMPRESSION: Patchy right infrahilar opacity, may represent atelectasis or pneumonia in the setting of fever. Electronically Signed   By: Keith Rake M.D.   On: 09/20/2019 17:51   DG C-Arm 1-60 Min-No  Report  Result Date: 09/20/2019 Fluoroscopy was utilized by the requesting physician.  No radiographic interpretation.    Assessment: Procedure(s): CYSTOSCOPY WITH RETROGRADE PYELOGRAM/URETERAL STENT PLACEMENT, 1 Day Post-Op  doing well.  Plan: 70 yo woman with 6 mm left obstructing ureteral calculus and UTI POD1 s/p cystoscopy with left ureteral stent placement.  -continue antibiotics -home meds -pain meds PRN -follow up urine culture results -likely dc home today    Jacalyn Lefevre, MD Urology 09/21/2019, 7:39 AM

## 2019-09-21 NOTE — Progress Notes (Signed)
PHARMACY - PHYSICIAN COMMUNICATION CRITICAL VALUE ALERT - BLOOD CULTURE IDENTIFICATION (BCID)  Hannah Nicholson is an 69 y.o. female who presented to [facility] on 09/20/2019 with a left ureteral stone and fever.  Assessment:  Pt underwent Cystoscopy with left ureteral stent placement on 4/26. BCID now + 2/4 E.coli.   Name of physician (or Provider) Contacted: Dr. Louis Meckel  Current antibiotics: cephalexin 500 mg PO q6h  Changes to prescribed antibiotics recommended:  Recommend transitioning antibiotics to ceftriaxone 2 g IV daily and follow culture for sensitivities.  Results for orders placed or performed during the hospital encounter of 09/20/19  Blood Culture ID Panel (Reflexed) (Collected: 09/20/2019  5:20 PM)  Result Value Ref Range   Enterococcus species NOT DETECTED NOT DETECTED   Listeria monocytogenes NOT DETECTED NOT DETECTED   Staphylococcus species NOT DETECTED NOT DETECTED   Staphylococcus aureus (BCID) NOT DETECTED NOT DETECTED   Streptococcus species NOT DETECTED NOT DETECTED   Streptococcus agalactiae NOT DETECTED NOT DETECTED   Streptococcus pneumoniae NOT DETECTED NOT DETECTED   Streptococcus pyogenes NOT DETECTED NOT DETECTED   Acinetobacter baumannii NOT DETECTED NOT DETECTED   Enterobacteriaceae species DETECTED (A) NOT DETECTED   Enterobacter cloacae complex NOT DETECTED NOT DETECTED   Escherichia coli DETECTED (A) NOT DETECTED   Klebsiella oxytoca NOT DETECTED NOT DETECTED   Klebsiella pneumoniae NOT DETECTED NOT DETECTED   Proteus species NOT DETECTED NOT DETECTED   Serratia marcescens NOT DETECTED NOT DETECTED   Carbapenem resistance NOT DETECTED NOT DETECTED   Haemophilus influenzae NOT DETECTED NOT DETECTED   Neisseria meningitidis NOT DETECTED NOT DETECTED   Pseudomonas aeruginosa NOT DETECTED NOT DETECTED   Candida albicans NOT DETECTED NOT DETECTED   Candida glabrata NOT DETECTED NOT DETECTED   Candida krusei NOT DETECTED NOT DETECTED   Candida  parapsilosis NOT DETECTED NOT DETECTED   Candida tropicalis NOT DETECTED NOT DETECTED    Lenis Noon, PharmD 09/21/2019  11:04 AM

## 2019-09-22 LAB — URINE CULTURE

## 2019-09-22 LAB — GLUCOSE, CAPILLARY: Glucose-Capillary: 224 mg/dL — ABNORMAL HIGH (ref 70–99)

## 2019-09-22 MED ORDER — BISACODYL 10 MG RE SUPP
10.0000 mg | Freq: Every day | RECTAL | Status: DC | PRN
  Administered 2019-09-22: 10 mg via RECTAL
  Filled 2019-09-22: qty 1

## 2019-09-22 NOTE — Progress Notes (Signed)
Urology Inpatient Progress Report  Left ureteral calculus [N20.1]  Procedure(s): CYSTOSCOPY WITH RETROGRADE PYELOGRAM/URETERAL STENT PLACEMENT  2 Days Post-Op   Intv/Subj: No acute events overnight.  Patient is without complaint.  Yesterday blood culture positive for E Coli.  Patient has remained afebrile.    Active Problems:   Left ureteral calculus  Current Facility-Administered Medications  Medication Dose Route Frequency Provider Last Rate Last Admin  . acetaminophen (TYLENOL) tablet 650 mg  650 mg Oral Q4H PRN Jacalyn Lefevre D, MD   650 mg at 09/21/19 1326  . cefTRIAXone (ROCEPHIN) 2 g in sodium chloride 0.9 % 100 mL IVPB  2 g Intravenous Q24H Ardis Hughs, MD 200 mL/hr at 09/21/19 1345 2 g at 09/21/19 1345  . diphenhydrAMINE (BENADRYL) injection 12.5 mg  12.5 mg Intravenous Q6H PRN Jacalyn Lefevre D, MD       Or  . diphenhydrAMINE (BENADRYL) 12.5 MG/5ML elixir 12.5 mg  12.5 mg Oral Q6H PRN Jacalyn Lefevre D, MD      . docusate sodium (COLACE) capsule 100 mg  100 mg Oral BID Jacalyn Lefevre D, MD   100 mg at 09/21/19 2241  . levothyroxine (SYNTHROID) tablet 50 mcg  50 mcg Oral Q0600 Robley Fries, MD   50 mcg at 09/22/19 0524  . loratadine (CLARITIN) tablet 10 mg  10 mg Oral Daily Daylynn Stumpp, Simone Curia D, MD      . metoprolol succinate (TOPROL-XL) 24 hr tablet 25 mg  25 mg Oral Daily Jacalyn Lefevre D, MD   25 mg at 09/21/19 0949  . morphine 2 MG/ML injection 2-4 mg  2-4 mg Intravenous Q2H PRN Jacalyn Lefevre D, MD      . ondansetron (ZOFRAN) injection 4 mg  4 mg Intravenous Q4H PRN Jacalyn Lefevre D, MD      . oxybutynin (DITROPAN) tablet 5 mg  5 mg Oral Q8H PRN Jacalyn Lefevre D, MD      . oxyCODONE (Oxy IR/ROXICODONE) immediate release tablet 5 mg  5 mg Oral Q4H PRN Jacalyn Lefevre D, MD   5 mg at 09/22/19 0524  . polyethylene glycol (MIRALAX / GLYCOLAX) packet 17 g  17 g Oral Daily Liberta Gimpel, Simone Curia D, MD      . senna (SENOKOT) tablet 8.6 mg  1 tablet Oral Daily Jacalyn Lefevre D, MD   8.6 mg at 09/21/19 1811  . sertraline (ZOLOFT) tablet 50 mg  50 mg Oral Daily Jacalyn Lefevre D, MD   50 mg at 09/21/19 0949  . sodium chloride flush (NS) 0.9 % injection 3 mL  3 mL Intravenous Once Hayden Rasmussen, MD      . tiZANidine (ZANAFLEX) tablet 2-4 mg  2-4 mg Oral TID PRN Jacalyn Lefevre D, MD         Objective: Vital: Vitals:   09/21/19 2031 09/21/19 2146 09/22/19 0147 09/22/19 0536  BP: (!) 147/69   119/71  Pulse: 65   65  Resp: 18   18  Temp: 98.2 F (36.8 C) 98.1 F (36.7 C) 97.8 F (36.6 C) 97.9 F (36.6 C)  TempSrc: Oral Oral Oral Oral  SpO2: 97%   98%  Weight:      Height:       I/Os: I/O last 3 completed shifts: In: 3900.1 [P.O.:2400; I.V.:400; IV Piggyback:1100.1] Out: [id number] [Urine:1550]  Physical Exam:  General: Patient is in no apparent distress Lungs: Normal respiratory effort, chest expands symmetrically. GI: The abdomen is soft and nontender  Ext: lower extremities symmetric  Lab  Results: Recent Labs    09/20/19 1708  WBC 8.6  HGB 13.4  HCT 38.2   Recent Labs    09/20/19 1708  NA 132*  K 3.7  CL 98  CO2 23  GLUCOSE 143*  BUN 14  CREATININE 1.02*  CALCIUM 9.8   Recent Labs    09/20/19 1708  INR 1.0   No results for input(s): LABURIN in the last 72 hours. Results for orders placed or performed during the hospital encounter of 09/20/19  Blood Culture (routine x 2)     Status: Abnormal (Preliminary result)   Collection Time: 09/20/19  5:09 PM   Specimen: BLOOD  Result Value Ref Range Status   Specimen Description   Final    BLOOD BLOOD RIGHT ARM Performed at [facility], [address] [place], [place] [address]    Special Requests   Final    BOTTLES DRAWN AEROBIC AND ANAEROBIC Blood Culture adequate volume Performed at [facility], [address] [place], [place] [address]    Culture  Setup Time   Final    GRAM NEGATIVE RODS AEROBIC BOTTLE ONLY CRITICAL VALUE NOTED.  VALUE IS  CONSISTENT WITH PREVIOUSLY REPORTED AND CALLED VALUE. Performed at [facility], [place] [address], [place], [place] [address]    Culture ESCHERICHIA COLI (A)  Final   Report Status PENDING  Incomplete  Blood Culture (routine x 2)     Status: Abnormal (Preliminary result)   Collection Time: 09/20/19  5:20 PM   Specimen: BLOOD  Result Value Ref Range Status   Specimen Description   Final    BLOOD BLOOD RIGHT HAND Performed at [facility], [address] [place], [place] [address]    Special Requests   Final    BOTTLES DRAWN AEROBIC AND ANAEROBIC Blood Culture adequate volume Performed at [facility], [address] [place], [place] [address]    Culture  Setup Time   Final    GRAM NEGATIVE RODS IN BOTH AEROBIC AND ANAEROBIC BOTTLES Organism ID to follow CRITICAL RESULT CALLED TO, READ BACK BY AND VERIFIED WITH: Christean Grief PharmD 10:45 09/21/19 ([email or web address]) Performed at [facility], [place] [address], [place], [place] [address]    Culture ESCHERICHIA COLI (A)  Final   Report Status PENDING  Incomplete  Blood Culture ID Panel (Reflexed)     Status: Abnormal   Collection Time: 09/20/19  5:20 PM  Result Value Ref Range Status   Enterococcus species NOT DETECTED NOT DETECTED Final   Listeria monocytogenes NOT DETECTED NOT DETECTED Final   Staphylococcus species NOT DETECTED NOT DETECTED Final   Staphylococcus aureus (BCID) NOT DETECTED NOT DETECTED Final   Streptococcus species NOT DETECTED NOT DETECTED Final   Streptococcus agalactiae NOT DETECTED NOT DETECTED Final   Streptococcus pneumoniae NOT DETECTED NOT DETECTED Final   Streptococcus pyogenes NOT DETECTED NOT DETECTED Final   Acinetobacter baumannii NOT DETECTED NOT DETECTED Final   Enterobacteriaceae species DETECTED (A) NOT DETECTED Final    Comment: Enterobacteriaceae represent a large family of gram-negative bacteria, not a single organism. CRITICAL RESULT CALLED TO, READ BACK BY AND VERIFIED  WITH: Christean Grief PharmD 10:45 09/21/19 ([email or web address])    Enterobacter cloacae complex NOT DETECTED NOT DETECTED Final   Escherichia coli DETECTED (A) NOT DETECTED Final    Comment: CRITICAL RESULT CALLED TO, READ BACK BY AND VERIFIED WITH: Christean Grief PharmD 10:45 09/21/19 ([email or web address])  Klebsiella oxytoca NOT DETECTED NOT DETECTED Final   Klebsiella pneumoniae NOT DETECTED NOT DETECTED Final   Proteus species NOT DETECTED NOT DETECTED Final   Serratia marcescens NOT DETECTED NOT DETECTED Final   Carbapenem resistance NOT DETECTED NOT DETECTED Final   Haemophilus influenzae NOT DETECTED NOT DETECTED Final   Neisseria meningitidis NOT DETECTED NOT DETECTED Final   Pseudomonas aeruginosa NOT DETECTED NOT DETECTED Final   Candida albicans NOT DETECTED NOT DETECTED Final   Candida glabrata NOT DETECTED NOT DETECTED Final   Candida krusei NOT DETECTED NOT DETECTED Final   Candida parapsilosis NOT DETECTED NOT DETECTED Final   Candida tropicalis NOT DETECTED NOT DETECTED Final    Comment: Performed at [facility], [place] [address], [place], [place] [address]  Urine Culture     Status: Abnormal   Collection Time: 09/20/19  6:46 PM   Specimen: Urine, Random  Result Value Ref Range Status   Specimen Description   Final    URINE, RANDOM Performed at [facility], [address] [place], [place] [address]    Special Requests   Final    NONE Performed at [facility], [address] [place], [place] [address]    Culture MULTIPLE SPECIES PRESENT, SUGGEST RECOLLECTION (A)  Final   Report Status 09/22/2019 FINAL  Final  Respiratory Panel by RT PCR (Flu A&B, Covid) - Nasopharyngeal Swab     Status: None   Collection Time: 09/20/19  7:16 PM   Specimen: Nasopharyngeal Swab  Result Value Ref Range Status   SARS Coronavirus 2 by RT PCR NEGATIVE NEGATIVE Final    Comment: (NOTE) SARS-CoV-2 target nucleic acids are NOT DETECTED. The SARS-CoV-2 RNA is generally detectable in upper  respiratoy specimens during the acute phase of infection. The lowest concentration of SARS-CoV-2 viral copies this assay can detect is 131 copies/mL. A negative result does not preclude SARS-Cov-2 infection and should not be used as the sole basis for treatment or other patient management decisions. A negative result may occur with  improper specimen collection/handling, submission of specimen other than nasopharyngeal swab, presence of viral mutation(s) within the areas targeted by this assay, and inadequate number of viral copies (<131 copies/mL). A negative result must be combined with clinical observations, patient history, and epidemiological information. The expected result is Negative. Fact Sheet for Patients:  PinkCheek.be Fact Sheet for Healthcare Providers:  GravelBags.it This test is not yet ap proved or cleared by the [place] FDA and  has been authorized for detection and/or diagnosis of SARS-CoV-2 by FDA under an Emergency Use Authorization (EUA). This EUA will remain  in effect (meaning this test can be used) for the duration of the COVID-19 declaration under Section 564(b)(1) of the Act, 21 U.S.C. section 360bbb-3(b)(1), unless the authorization is terminated or revoked sooner.    Influenza A by PCR NEGATIVE NEGATIVE Final   Influenza B by PCR NEGATIVE NEGATIVE Final    Comment: (NOTE) The Xpert Xpress SARS-CoV-2/FLU/RSV assay is intended as an aid in  the diagnosis of influenza from Nasopharyngeal swab specimens and  should not be used as a sole basis for treatment. Nasal washings and  aspirates are unacceptable for Xpert Xpress SARS-CoV-2/FLU/RSV  testing. Fact Sheet for Patients: PinkCheek.be Fact Sheet for Healthcare Providers: GravelBags.it This test is not yet approved or cleared by the [place] FDA and  has been authorized for  detection and/or diagnosis of SARS-CoV-2 by  FDA under an Emergency Use Authorization (EUA).  This EUA will remain  in effect (meaning this test can be used) for the duration of the  Covid-19 declaration under Section 564(b)(1) of the Act, 21  U.S.C. section 360bbb-3(b)(1), unless the authorization is  terminated or revoked. Performed at [facility], [address] [place], [place] [address]     Studies/Results: CT Abdomen Pelvis Wo Contrast  Result Date: 09/20/2019 CLINICAL DATA:  Abdominal pain on the left with chills, initial encounter EXAM: CT ABDOMEN AND PELVIS WITHOUT CONTRAST TECHNIQUE: Multidetector CT imaging of the abdomen and pelvis was performed following the standard protocol without IV contrast. COMPARISON:  07/17/2013 FINDINGS: Lower chest: Lung bases are free of acute infiltrate or sizable effusion. Changes consistent with prior left mastectomy are noted new from the prior exam. Hepatobiliary: Fatty infiltration of the liver is noted. The gallbladder has been surgically removed. Pancreas: Unremarkable. No pancreatic ductal dilatation or surrounding inflammatory changes. Spleen: Normal in size without focal abnormality. Adrenals/Urinary Tract: Adrenal glands are within normal limits. Kidneys are well visualized bilaterally. Nonobstructing renal stones are seen on the right measuring approximately 6 mm in dimension. No obstructive changes are seen. The right ureter is within normal limits. Left kidney demonstrates evidence of hydronephrosis and proximal hydroureter secondary to a 6 mm stone best seen on image number 38 of series 2 and image number 56 of series 5. The more distal left ureter is within normal limits. The bladder is well distended. Stomach/Bowel: Diverticular change of the colon is noted without evidence of diverticulitis. No obstructive changes of the colon are seen. The appendix is within normal limits. Small bowel and stomach are unremarkable.  Vascular/Lymphatic: Aortic atherosclerosis. No enlarged abdominal or pelvic lymph nodes. Multiple ovarian vein phleboliths are noted bilaterally. Reproductive: Status post hysterectomy. No adnexal masses. Other: No abdominal wall hernia or abnormality. No abdominopelvic ascites. Musculoskeletal: Degenerative changes of lumbar spine are noted. No acute bony abnormality is seen. IMPRESSION: 6 mm proximal left ureteral stone with hydronephrosis and hydroureter. Some mild inflammatory changes of the left kidney are noted as well. Nonobstructing right renal stones as described. Diverticulosis without diverticulitis. Electronically Signed   By: Inez Catalina M.D.   On: 09/20/2019 18:32   DG Chest Port 1 View  Result Date: 09/20/2019 CLINICAL DATA:  Fever. Left-sided abdominal and low back pain. EXAM: PORTABLE CHEST 1 VIEW COMPARISON:  Radiograph 03/05/2010 FINDINGS: The cardiomediastinal contours are normal. Patchy right infrahilar opacity. Lingular scarring unchanged. Pulmonary vasculature is normal. No pleural effusion or pneumothorax. No acute osseous abnormalities are seen. IMPRESSION: Patchy right infrahilar opacity, may represent atelectasis or pneumonia in the setting of fever. Electronically Signed   By: Keith Rake M.D.   On: 09/20/2019 17:51   DG C-Arm 1-60 Min-No Report  Result Date: 09/20/2019 Fluoroscopy was utilized by the requesting physician.  No radiographic interpretation.    Assessment: 70 yo woman with 3mm left obstructing ureteral calculus and UTI POD2 s/p cystoscopy with left ureteral stent placement now with E colic bacteremia.   Plan: -continue IV rocphecin until antibiotic sensitivities return then transition to oral antibiotics -home meds -pain meds PRN -SCDs/OOB -anticipated dc possible later today   Jacalyn Lefevre, MD Urology 09/22/2019, 9:08 AM

## 2019-09-23 LAB — CULTURE, BLOOD (ROUTINE X 2)
Special Requests: ADEQUATE
Special Requests: ADEQUATE

## 2019-09-23 MED ORDER — SULFAMETHOXAZOLE-TRIMETHOPRIM 800-160 MG PO TABS: 1.0000 | ORAL_TABLET | Freq: Two times a day (BID) | ORAL | 0 refills | Status: AC

## 2019-09-23 NOTE — Progress Notes (Signed)
Assessment unchanged. Pt verbalized understanding of dc instructions through teach back including medications, follow up care and when to call the doctor. Discharged via wc to front entrance accompanied by NT and son.

## 2019-09-23 NOTE — Discharge Instructions (Signed)
Cystoscopy patient instructions  Following a cystoscopy, a catheter (a flexible rubber tube) is sometimes left in place to empty the bladder. This may cause some discomfort or a feeling that you need to urinate. Your doctor determines the period of time that the catheter will be left in place. You may have bloody urine for two to three days (Call your doctor if the amount of bleeding increases or does not subside).  You may pass blood clots in your urine, especially if you had a biopsy. It is not unusual to pass small blood clots and have some bloody urine a couple of weeks after your cystoscopy. Again, call your doctor if the bleeding does not subside. You may have: Dysuria (painful urination) Frequency (urinating often) Urgency (strong desire to urinate)  These symptoms are common especially if medicine is instilled into the bladder or a ureteral stent is placed. Avoiding alcohol and caffeine, such as coffee, tea, and chocolate, may help relieve these symptoms. Drink plenty of water, unless otherwise instructed. Your doctor may also prescribe an antibiotic or other medicine to reduce these symptoms.  Cystoscopy results are available soon after the procedure; biopsy results usually take two to four days. Your doctor will discuss the results of your exam with you. Before you go home, you will be given specific instructions for follow-up care. Special Instructions:  1 If you are going home with a catheter in place do not take a tub bath until removed by your doctor.  2 You may resume your normal activities.  3 Do not drive or operate machinery if you are taking narcotic pain medicine.  4 Be sure to keep all follow-up appointments with your doctor.   5 Call Your Doctor If: The catheter is not draining  You have severe pain  You are unable to urinate  You have a fever over 101  You have severe bleeding          Medications Flomax - for stent discomfort Pyridium - for burning with  uriation Bactrim - antibiotic for infection (do not take cephalexin)

## 2019-09-24 ENCOUNTER — Other Ambulatory Visit: Payer: Self-pay | Admitting: Urology

## 2019-09-28 DIAGNOSIS — N202 Calculus of kidney with calculus of ureter: Secondary | ICD-10-CM | POA: Diagnosis not present

## 2019-09-28 DIAGNOSIS — N3000 Acute cystitis without hematuria: Secondary | ICD-10-CM | POA: Diagnosis not present

## 2019-09-28 DIAGNOSIS — R8279 Other abnormal findings on microbiological examination of urine: Secondary | ICD-10-CM | POA: Diagnosis not present

## 2019-10-08 ENCOUNTER — Telehealth: Payer: Self-pay | Admitting: Oncology

## 2019-10-08 NOTE — Telephone Encounter (Signed)
Received anew pt referral from Dr. Harrington Challenger for hx of breast cancer. Pt has been called and scheduled to see Dr. Jana Hakim on 6/1 at 4pm. Hannah Nicholson was treated at the [facility]. She's aware to have the records sent to our office. I provided the address to our facility to have the records mailed.

## 2019-10-12 ENCOUNTER — Other Ambulatory Visit: Payer: Self-pay

## 2019-10-12 ENCOUNTER — Encounter (HOSPITAL_BASED_OUTPATIENT_CLINIC_OR_DEPARTMENT_OTHER): Payer: Self-pay | Admitting: Urology

## 2019-10-12 NOTE — Progress Notes (Addendum)
Addendum: spoke with jessica zanetto pa ok to proceed.  Spoke w/ via phone for pre-op interview---patient Lab needs dos----  I stat 8          COVID test ------10-15-2019 at 1335 Arrive at ------630 am 10-19-2019- NPO after ------midnight Medications to take morning of surgery -----sertraline, metorpolol succinate, synthroid, oxybutynin Diabetic medication -----none day of surgery Patient Special Instructions -----none Pre-Op special Istructions -----none Patient verbalized understanding of instructions that were given at this phone interview. Patient denies shortness of breath, chest pain, fever, cough a this phone interview.  Anesthesia : chest xray 1 view done 09-20-2019 showed possible pneumonia, ct abdomen Jerene Pitch 09-20-2019 showed left lower lung clear Chest xray to jessica zanetto pa for review  PCP:dr charles alan ross Cardiologist :none Chest x-ray :09-20-2019 1 view epic EKG :4-26-201 epic Echo :none Cardiac Cath : none Sleep Study/ CPAP :n/a Fasting Blood Sugar 130 to 140:      / Checks Blood Sugar -- times a day:  Rarely checks blood sugar Blood Thinner/ Instructions /Last Dose:n/a ASA / Instructions/ Last Dose : n/a  Patient denies shortness of breath, chest pain, fever, and cough at this phone interview.

## 2019-10-13 DIAGNOSIS — R69 Illness, unspecified: Secondary | ICD-10-CM | POA: Diagnosis not present

## 2019-10-15 ENCOUNTER — Other Ambulatory Visit (HOSPITAL_COMMUNITY)
Admission: RE | Admit: 2019-10-15 | Discharge: 2019-10-15 | Disposition: A | Discharge status: Home / Self Care | Payer: Medicare HMO | Source: Ambulatory Visit | Attending: Urology | Admitting: Urology

## 2019-10-15 DIAGNOSIS — Z01812 Encounter for preprocedural laboratory examination: Secondary | ICD-10-CM | POA: Insufficient documentation

## 2019-10-15 DIAGNOSIS — Z20822 Contact with and (suspected) exposure to covid-19: Secondary | ICD-10-CM | POA: Insufficient documentation

## 2019-10-16 LAB — SARS CORONAVIRUS 2 (TAT 6-24 HRS): SARS Coronavirus 2: NEGATIVE

## 2019-10-18 NOTE — H&P (Signed)
CC/HPI: cc: postop   09/28/19: 69 year old woman with recent hospital admission for 6 mm left obstructing ureteral calculus and infection requiring urgent stent placement 09/20/19 and treatment for bacteremia returns for follow-up. She is scheduled for definitive management of the stone with ureteroscopy/laser lithotripsy/stent exchange in a few weeks. She remains on antibiotics has been doing well. She is experiencing worsening bladder spasms and dysuria.     ALLERGIES: Iodine With Contrast    MEDICATIONS: Hydrochlorothiazide 25 mg tablet  Metoprolol Succinate 25 mg tablet, extended release 24 hr  Oxybutynin Chloride 5 mg tablet  Phenazopyridine Hcl 100 mg tablet  Tamsulosin Hcl 0.4 mg capsule  Calcium Carbonate  Fexofenadine Hcl 180 mg tablet  Ibuprofen 600 mg tablet  Metformin Hcl Er 500 mg tablet, extended release 24 hr  Multivitamin  Sertraline Hcl 50 mg tablet  Sulfamethoxazole-Trimethoprim 800 mg-160 mg tablet  Synthroid 50 mcg tablet  Telmisartan 80 mg tablet  Vitamin B12  Vitamin D3     GU PSH: Cystoscopy Insert Stent, Left - 09/20/2019     NON-GU PSH: None   GU PMH: None   NON-GU PMH: None   FAMILY HISTORY: None   SOCIAL HISTORY: Marital Status: Married Preferred Language: English; Ethnicity: Not Hispanic Or Latino; Race: Black or African American Current Smoking Status: Patient has never smoked.   Tobacco Use Assessment Completed: Used Tobacco in last 30 days? Has never drank.  Drinks 1 caffeinated drink per day.    REVIEW OF SYSTEMS:    GU Review Female:   Patient reports frequent urination, burning /pain with urination, and get up at night to urinate. Patient denies hard to postpone urination, leakage of urine, stream starts and stops, trouble starting your stream, have to strain to urinate, and being pregnant.  Gastrointestinal (Upper):   Patient denies nausea, vomiting, and indigestion/ heartburn.  Gastrointestinal (Lower):   Patient reports constipation.  Patient denies diarrhea.  Constitutional:   Patient reports fatigue. Patient denies fever, night sweats, and weight loss.  Skin:   Patient denies skin rash/ lesion and itching.  Eyes:   Patient denies blurred vision and double vision.  Ears/ Nose/ Throat:   Patient denies sore throat and sinus problems.  Hematologic/Lymphatic:   Patient denies swollen glands and easy bruising.  Cardiovascular:   Patient denies leg swelling and chest pains.  Respiratory:   Patient denies shortness of breath and cough.  Endocrine:   Patient denies excessive thirst.  Musculoskeletal:   Patient denies back pain and joint pain.  Neurological:   Patient denies headaches and dizziness.  Psychologic:   Patient denies depression and anxiety.   VITAL SIGNS:      09/28/2019 03:51 PM  Weight 189 lb / 85.73 kg  Height 63 in / 160.02 cm  BP 129/83 mmHg  Pulse 67 /min  BMI 33.5 kg/m   MULTI-SYSTEM PHYSICAL EXAMINATION:    Constitutional: Well-nourished. No physical deformities. Normally developed. Good grooming.  Neck: Neck symmetrical, not swollen. Normal tracheal position.  Respiratory: No labored breathing, no use of accessory muscles.   Cardiovascular: Normal temperature  Skin: No paleness, no jaundice, no cyanosis. No lesion, no ulcer, no rash.  Neurologic / Psychiatric: Oriented to time, oriented to place, oriented to person. No depression, no anxiety, no agitation.  Gastrointestinal: No rigidity, non obese abdomen.   Eyes: Normal conjunctivae. Normal eyelids.  Ears, Nose, Mouth, and Throat: Left ear no scars, no lesions, no masses. Right ear no scars, no lesions, no masses. Nose no scars, no lesions, no  masses. Normal hearing. Normal lips.  Musculoskeletal: Normal gait and station of head and neck.     Complexity of Data:  Records Review:   POC Tool  Urine Test Review:   Urinalysis  X-Ray Review: C.T. Abdomen/Pelvis: Reviewed Films. Reviewed Report. Discussed With Patient. 6 mm proximal left ureteral  calculus, nonobstructing 6 mm right renal calculus   Notes:                     09/20/2019: BUN 14, creatinine 1.02   PROCEDURES:          Urinalysis w/Scope Micro  WBC/hpf: 6 - 10/hpf  RBC/hpf: >60/hpf  Bacteria: Few (10-25/hpf)  Cystals: NS (Not Seen)  Casts: NS (Not Seen)  Trichomonas: Not Present  Mucous: Present  Epithelial Cells: 0 - 5/hpf  Yeast: NS (Not Seen)  Sperm: Not Present    ASSESSMENT:      ICD-10 Details  1 GU:   Renal and ureteral calculus - N20.2 Chronic, Stable - Patient is scheduled for a ureteroscopy with left laser lithotripsy and stent exchange in a few weeks. The risks and benefits of procedure discussed with the patient including pain, bleeding, infection, stent discomfort, damage to surrounding structures, inability remove the stone, need for future treatment. The patient understands these risks and is ready to proceed. We discussed doing a 24 hour urine collection after she has recovered from the next surgery.  2   Acute Cystitis/UTI - N30.00 Acute, Systemic Symptoms - Patient is being treated for bacteremia with culture directed antibiotics. She is currently on Bactrim. I will send urine for culture today to ensure that this has resolved.   PLAN:            Medications New Meds: Detrol La 4 mg capsule, ext release 24 hr 1 capsule PO Daily   #30  0 Refill(s)

## 2019-10-18 NOTE — Anesthesia Preprocedure Evaluation (Addendum)
Anesthesia Evaluation  Patient identified by MRN, date of birth, ID band Patient awake    Reviewed: Allergy & Precautions, NPO status , Patient's Chart, lab work & pertinent test results  Airway Mallampati: II  TM Distance: >3 FB Neck ROM: Full    Dental  (+) Dental Advisory Given   Pulmonary asthma ,    breath sounds clear to auscultation       Cardiovascular hypertension, Pt. on medications and Pt. on home beta blockers  Rhythm:Regular Rate:Normal     Neuro/Psych negative neurological ROS     GI/Hepatic Neg liver ROS, GERD  ,  Endo/Other  diabetesHypothyroidism   Renal/GU Renal disease     Musculoskeletal   Abdominal   Peds  Hematology negative hematology ROS (+)   Anesthesia Other Findings   Reproductive/Obstetrics                            Anesthesia Physical Anesthesia Plan  ASA: II  Anesthesia Plan: General   Post-op Pain Management:    Induction: Intravenous  PONV Risk Score and Plan: 3 and Dexamethasone, Ondansetron and Treatment may vary due to age or medical condition  Airway Management Planned: LMA  Additional Equipment:   Intra-op Plan:   Post-operative Plan: Extubation in OR  Informed Consent: I have reviewed the patients History and Physical, chart, labs and discussed the procedure including the risks, benefits and alternatives for the proposed anesthesia with the patient or authorized representative who has indicated his/her understanding and acceptance.     Dental advisory given  Plan Discussed with: CRNA  Anesthesia Plan Comments:        Anesthesia Quick Evaluation

## 2019-10-19 ENCOUNTER — Encounter (HOSPITAL_BASED_OUTPATIENT_CLINIC_OR_DEPARTMENT_OTHER)
Admission: RE | Disposition: A | Discharge status: Home / Self Care | Payer: Self-pay | Source: Home / Self Care | Attending: Urology

## 2019-10-19 ENCOUNTER — Ambulatory Visit (HOSPITAL_BASED_OUTPATIENT_CLINIC_OR_DEPARTMENT_OTHER): Payer: Medicare HMO | Admitting: Physician Assistant

## 2019-10-19 ENCOUNTER — Encounter (HOSPITAL_BASED_OUTPATIENT_CLINIC_OR_DEPARTMENT_OTHER): Payer: Self-pay | Admitting: Urology

## 2019-10-19 ENCOUNTER — Ambulatory Visit (HOSPITAL_BASED_OUTPATIENT_CLINIC_OR_DEPARTMENT_OTHER)
Admission: RE | Admit: 2019-10-19 | Discharge: 2019-10-19 | Disposition: A | Discharge status: Home / Self Care | Payer: Medicare HMO | Attending: Urology | Admitting: Urology

## 2019-10-19 DIAGNOSIS — Z7984 Long term (current) use of oral hypoglycemic drugs: Secondary | ICD-10-CM | POA: Insufficient documentation

## 2019-10-19 DIAGNOSIS — N201 Calculus of ureter: Secondary | ICD-10-CM | POA: Diagnosis not present

## 2019-10-19 DIAGNOSIS — Z91041 Radiographic dye allergy status: Secondary | ICD-10-CM | POA: Insufficient documentation

## 2019-10-19 DIAGNOSIS — J45909 Unspecified asthma, uncomplicated: Secondary | ICD-10-CM | POA: Diagnosis not present

## 2019-10-19 DIAGNOSIS — N202 Calculus of kidney with calculus of ureter: Secondary | ICD-10-CM | POA: Diagnosis not present

## 2019-10-19 DIAGNOSIS — Z79899 Other long term (current) drug therapy: Secondary | ICD-10-CM | POA: Insufficient documentation

## 2019-10-19 DIAGNOSIS — Z791 Long term (current) use of non-steroidal anti-inflammatories (NSAID): Secondary | ICD-10-CM | POA: Insufficient documentation

## 2019-10-19 DIAGNOSIS — N189 Chronic kidney disease, unspecified: Secondary | ICD-10-CM | POA: Diagnosis not present

## 2019-10-19 DIAGNOSIS — E7800 Pure hypercholesterolemia, unspecified: Secondary | ICD-10-CM | POA: Diagnosis not present

## 2019-10-19 DIAGNOSIS — K219 Gastro-esophageal reflux disease without esophagitis: Secondary | ICD-10-CM | POA: Insufficient documentation

## 2019-10-19 DIAGNOSIS — N3000 Acute cystitis without hematuria: Secondary | ICD-10-CM | POA: Insufficient documentation

## 2019-10-19 DIAGNOSIS — I129 Hypertensive chronic kidney disease with stage 1 through stage 4 chronic kidney disease, or unspecified chronic kidney disease: Secondary | ICD-10-CM | POA: Diagnosis not present

## 2019-10-19 DIAGNOSIS — I10 Essential (primary) hypertension: Secondary | ICD-10-CM | POA: Diagnosis not present

## 2019-10-19 HISTORY — DX: Type 2 diabetes mellitus without complications: E11.9

## 2019-10-19 HISTORY — DX: Unspecified asthma, uncomplicated: J45.909

## 2019-10-19 HISTORY — PX: CYSTOSCOPY/URETEROSCOPY/HOLMIUM LASER/STENT PLACEMENT: SHX6546

## 2019-10-19 LAB — POCT I-STAT, CHEM 8
BUN: 13 mg/dL (ref 8–23)
Calcium, Ion: 1.29 mmol/L (ref 1.15–1.40)
Chloride: 106 mmol/L (ref 98–111)
Creatinine, Ser: 0.9 mg/dL (ref 0.44–1.00)
Glucose, Bld: 183 mg/dL — ABNORMAL HIGH (ref 70–99)
HCT: 35 % — ABNORMAL LOW (ref 36.0–46.0)
Hemoglobin: 11.9 g/dL — ABNORMAL LOW (ref 12.0–15.0)
Potassium: 3.9 mmol/L (ref 3.5–5.1)
Sodium: 139 mmol/L (ref 135–145)
TCO2: 26 mmol/L (ref 22–32)

## 2019-10-19 LAB — GLUCOSE, CAPILLARY: Glucose-Capillary: 176 mg/dL — ABNORMAL HIGH (ref 70–99)

## 2019-10-19 SURGERY — CYSTOSCOPY/URETEROSCOPY/HOLMIUM LASER/STENT PLACEMENT
Anesthesia: General | Site: Pelvis | Laterality: Left

## 2019-10-19 MED ORDER — CEFTRIAXONE SODIUM 2 G IJ SOLR
INTRAMUSCULAR | Status: AC
  Filled 2019-10-19: qty 20

## 2019-10-19 MED ORDER — PROPOFOL 10 MG/ML IV BOLUS
INTRAVENOUS | Status: AC
  Filled 2019-10-19: qty 40

## 2019-10-19 MED ORDER — DEXAMETHASONE SODIUM PHOSPHATE 10 MG/ML IJ SOLN
INTRAMUSCULAR | Status: DC | PRN
  Administered 2019-10-19: 4 mg via INTRAVENOUS

## 2019-10-19 MED ORDER — MIDAZOLAM HCL 2 MG/2ML IJ SOLN
INTRAMUSCULAR | Status: AC
  Filled 2019-10-19: qty 2

## 2019-10-19 MED ORDER — ACETAMINOPHEN 500 MG PO TABS
ORAL_TABLET | ORAL | Status: AC
  Filled 2019-10-19: qty 2

## 2019-10-19 MED ORDER — PHENAZOPYRIDINE HCL 100 MG PO TABS
100.0000 mg | ORAL_TABLET | Freq: Three times a day (TID) | ORAL | 0 refills | Status: DC | PRN
Start: 2019-10-19 — End: 2019-10-26

## 2019-10-19 MED ORDER — PROPOFOL 10 MG/ML IV BOLUS
INTRAVENOUS | Status: DC | PRN
  Administered 2019-10-19: 30 mg via INTRAVENOUS
  Administered 2019-10-19: 170 mg via INTRAVENOUS
  Administered 2019-10-19: 40 mg via INTRAVENOUS

## 2019-10-19 MED ORDER — SODIUM CHLORIDE 0.9 % IR SOLN
Status: DC | PRN
  Administered 2019-10-19: 3000 mL

## 2019-10-19 MED ORDER — ONDANSETRON HCL 4 MG/2ML IJ SOLN
INTRAMUSCULAR | Status: AC
  Filled 2019-10-19: qty 2

## 2019-10-19 MED ORDER — ONDANSETRON HCL 4 MG/2ML IJ SOLN
INTRAMUSCULAR | Status: DC | PRN
  Administered 2019-10-19: 4 mg via INTRAVENOUS

## 2019-10-19 MED ORDER — LIDOCAINE 2% (20 MG/ML) 5 ML SYRINGE
INTRAMUSCULAR | Status: DC | PRN
  Administered 2019-10-19: 100 mg via INTRAVENOUS

## 2019-10-19 MED ORDER — LACTATED RINGERS IV SOLN: INTRAVENOUS | Status: DC

## 2019-10-19 MED ORDER — KETOROLAC TROMETHAMINE 30 MG/ML IJ SOLN
INTRAMUSCULAR | Status: AC
  Filled 2019-10-19: qty 1

## 2019-10-19 MED ORDER — FENTANYL CITRATE (PF) 100 MCG/2ML IJ SOLN: 25.0000 ug | INTRAMUSCULAR | Status: DC | PRN

## 2019-10-19 MED ORDER — EPHEDRINE 5 MG/ML INJ
INTRAVENOUS | Status: AC
  Filled 2019-10-19: qty 10

## 2019-10-19 MED ORDER — TAMSULOSIN HCL 0.4 MG PO CAPS: 0.4000 mg | ORAL_CAPSULE | Freq: Every day | ORAL | 0 refills | Status: DC

## 2019-10-19 MED ORDER — DEXAMETHASONE SODIUM PHOSPHATE 10 MG/ML IJ SOLN
INTRAMUSCULAR | Status: AC
  Filled 2019-10-19: qty 1

## 2019-10-19 MED ORDER — SODIUM CHLORIDE 0.9 % IV SOLN
INTRAVENOUS | Status: AC
  Filled 2019-10-19: qty 100

## 2019-10-19 MED ORDER — MIDAZOLAM HCL 5 MG/5ML IJ SOLN
INTRAMUSCULAR | Status: DC | PRN
  Administered 2019-10-19 (×2): 1 mg via INTRAVENOUS

## 2019-10-19 MED ORDER — OXYBUTYNIN CHLORIDE 5 MG PO TABS: 5.0000 mg | ORAL_TABLET | Freq: Three times a day (TID) | ORAL | 0 refills | Status: DC | PRN

## 2019-10-19 MED ORDER — SODIUM CHLORIDE 0.9 % IV SOLN: INTRAVENOUS | Status: DC

## 2019-10-19 MED ORDER — SODIUM CHLORIDE 0.9 % IV SOLN
2.0000 g | INTRAVENOUS | Status: AC
  Administered 2019-10-19: 2 g via INTRAVENOUS

## 2019-10-19 MED ORDER — LIDOCAINE 2% (20 MG/ML) 5 ML SYRINGE
INTRAMUSCULAR | Status: AC
  Filled 2019-10-19: qty 5

## 2019-10-19 MED ORDER — ACETAMINOPHEN 500 MG PO TABS
1000.0000 mg | ORAL_TABLET | Freq: Once | ORAL | Status: AC
  Administered 2019-10-19: 1000 mg via ORAL

## 2019-10-19 MED ORDER — CEPHALEXIN 500 MG PO CAPS: 500.0000 mg | ORAL_CAPSULE | Freq: Once | ORAL | 0 refills | Status: AC

## 2019-10-19 MED ORDER — KETOROLAC TROMETHAMINE 30 MG/ML IJ SOLN
INTRAMUSCULAR | Status: DC | PRN
  Administered 2019-10-19: 15 mg via INTRAVENOUS

## 2019-10-19 MED ORDER — TRAMADOL HCL 50 MG PO TABS: 50.0000 mg | ORAL_TABLET | Freq: Four times a day (QID) | ORAL | 0 refills | Status: DC | PRN

## 2019-10-19 MED ORDER — ONDANSETRON HCL 4 MG/2ML IJ SOLN: 4.0000 mg | Freq: Once | INTRAMUSCULAR | Status: DC | PRN

## 2019-10-19 MED ORDER — FENTANYL CITRATE (PF) 100 MCG/2ML IJ SOLN
INTRAMUSCULAR | Status: AC
  Filled 2019-10-19: qty 2

## 2019-10-19 MED ORDER — FENTANYL CITRATE (PF) 100 MCG/2ML IJ SOLN
INTRAMUSCULAR | Status: DC | PRN
  Administered 2019-10-19 (×2): 50 ug via INTRAVENOUS

## 2019-10-19 MED ORDER — EPHEDRINE SULFATE-NACL 50-0.9 MG/10ML-% IV SOSY
PREFILLED_SYRINGE | INTRAVENOUS | Status: DC | PRN
  Administered 2019-10-19: 10 mg via INTRAVENOUS

## 2019-10-19 SURGICAL SUPPLY — 20 items
BAG DRAIN URO-CYSTO SKYTR STRL (DRAIN) ×2 IMPLANT
BAG DRN UROCATH (DRAIN) ×1
BASKET ZERO TIP NITINOL 2.4FR (BASKET) ×1 IMPLANT
BSKT STON RTRVL ZERO TP 2.4FR (BASKET) ×1
CATH URET 5FR 28IN OPEN ENDED (CATHETERS) ×2 IMPLANT
CLOTH BEACON ORANGE TIMEOUT ST (SAFETY) ×2 IMPLANT
DRSG TEGADERM 4X4.75 (GAUZE/BANDAGES/DRESSINGS) IMPLANT
EXTRACTOR STONE 1.7FRX115CM (UROLOGICAL SUPPLIES) IMPLANT
FIBER LASER TRAC TIP (UROLOGICAL SUPPLIES) ×1 IMPLANT
GLOVE BIO SURGEON STRL SZ 6.5 (GLOVE) ×2 IMPLANT
GOWN STRL REUS W/TWL LRG LVL3 (GOWN DISPOSABLE) ×2 IMPLANT
GUIDEWIRE STR DUAL SENSOR (WIRE) ×3 IMPLANT
IV NS IRRIG 3000ML ARTHROMATIC (IV SOLUTION) ×2 IMPLANT
KIT TURNOVER CYSTO (KITS) ×2 IMPLANT
MANIFOLD NEPTUNE II (INSTRUMENTS) ×2 IMPLANT
SHEATH URETERAL 12FRX28CM (UROLOGICAL SUPPLIES) ×1 IMPLANT
STENT URET 6FRX24 CONTOUR (STENTS) ×1 IMPLANT
TRAY CYSTO PACK (CUSTOM PROCEDURE TRAY) ×2 IMPLANT
TUBE CONNECTING 12X1/4 (SUCTIONS) ×2 IMPLANT
TUBING UROLOGY SET (TUBING) ×2 IMPLANT

## 2019-10-19 NOTE — Op Note (Signed)
Preoperative diagnosis: left ureteral calculus  Postoperative diagnosis: left ureteral calculus  Procedure:  1. Cystoscopy 2. left ureteroscopy and stone removal 3. Ureteroscopic laser lithotripsy 4. left 80F x 24 ureteral stent placement    Surgeon: Jacalyn Lefevre, MD  Anesthesia: General  Complications: None  Intraoperative findings:  1. Normal urethra 2. Normal bladder without masses or lesions 3. Orthotopic UOs 4. Left 80Fr x 24cm JJ ureteral stent with string at end of case 5. 6 mm calculus fragmented and basketed  EBL: Minimal  Specimens: 1. left ureteral calculus  Disposition of specimens: [facility] for stone analysis  Indication: Hannah Nicholson is a 69 y.o.   patient with a  left ureteral stone and associated left symptoms. After reviewing the management options for treatment, the patient elected to proceed with the above surgical procedure(s). We have discussed the potential benefits and risks of the procedure, side effects of the proposed treatment, the likelihood of the patient achieving the goals of the procedure, and any potential problems that might occur during the procedure or recuperation. Informed consent has been obtained.   Description of procedure:  The patient was taken to the operating room and general anesthesia was induced.  The patient was placed in the dorsal lithotomy position, prepped and draped in the usual sterile fashion, and preoperative antibiotics were administered. A preoperative time-out was performed.   Cystourethroscopy was performed.  The patient's urethra was examined and was normal. The bladder was then systematically examined in its entirety. There was no evidence for any bladder tumors, stones, or other mucosal pathology.    Attention then turned to the left ureteral orifice and graspers were used to grab the existing stent and bring it to the urethral meatus.  A 0.38 sensor wire was then advanced through the  ureteral stent to the kidney with fluoroscopic guidance and the stent was discarded.  A second wire was then advanced alongside the existing wire.  A ureteral access sheath was then placed over one of the wires with fluoroscopic guidance.  The inner sheath and wire were removed.  The flexible ureteroscope was then advanced into the ureter through the access sheath and the calculus was identified in the midpole.   The stone was then fragmented with the 200 micron holmium laser fiber. All stones were then removed from the ureter with an 0 tip basket.  Reinspection of the ureter revealed no remaining visible stones or fragments.   The wire was then backloaded through the cystoscope and a ureteral stent was advance over the wire using Seldinger technique.  The stent was positioned appropriately under fluoroscopic and cystoscopic guidance.  The wire was then removed with an adequate stent curl noted in the renal pelvis as well as in the bladder.  The bladder was then emptied and the procedure ended.  The patient appeared to tolerate the procedure well and without complications.  The patient was able to be awakened and transferred to the recovery unit in satisfactory condition.   Disposition: The tether of the stent was left on and tucked inside the patient's vagina.  Instructions for removing the stent have been provided to the patient. The patient has been scheduled for followup in 6 weeks with a renal ultrasound.

## 2019-10-19 NOTE — Discharge Instructions (Signed)
DISCHARGE INSTRUCTIONS FOR KIDNEY STONE/URETERAL STENT   MEDICATIONS:  1.  Resume all your other meds from home - except do not take any extra narcotic pain meds that you may have at home.  2. Pyridium is to help with the burning/stinging when you urinate. 3. Tramadol is for moderate/severe pain, otherwise taking upto 1000 mg every 6 hours of plainTylenol will help treat your pain.   4. Take cephalexin one hour prior to removal of your stent.  5. Oxybutynin is to help with bladder cramping/pressure  ACTIVITY:  1. No strenuous activity x 1week  2. No driving while on narcotic pain medications  3. Drink plenty of water  4. Continue to walk at home - you can still get blood clots when you are at home, so keep active, but don't over do it.  5. May return to work/school tomorrow or when you feel ready   BATHING:  1. You can shower and we recommend daily showers  2. You have a string coming from your urethra: The stent string is attached to your ureteral stent. Do not pull on this.   SIGNS/SYMPTOMS TO CALL:  Please call us if you have a fever greater than 101.5, uncontrolled nausea/vomiting, uncontrolled pain, dizziness, unable to urinate, bloody urine, chest pain, shortness of breath, leg swelling, leg pain, redness around wound, drainage from wound, or any other concerns or questions.   You can reach [place] at [phone].   FOLLOW-UP:  1. You will be contacted to have an appointment in 6 weeks with a ultrasound of your kidneys prior.  2. You have a string attached to your stent, you may remove it on Friday, May 28. To do this, pull the strings until the stents are completely removed. You may feel an odd sensation in your back.  Please take the antibiotic prior to removing stent.    No advil, aleve, motrin, ibuprofen until 2 pm today    Post Anesthesia Home Care Instructions  Activity: Get plenty of rest for the remainder of the day. A responsible adult should stay with you for 24 hours  following the procedure.  For the next 24 hours, DO NOT: -Drive a car -Paediatric nurse -Drink alcoholic beverages -Take any medication unless instructed by your physician -Make any legal decisions or sign important papers.  Meals: Start with liquid foods such as gelatin or soup. Progress to regular foods as tolerated. Avoid greasy, spicy, heavy foods. If nausea and/or vomiting occur, drink only clear liquids until the nausea and/or vomiting subsides. Call your physician if vomiting continues.  Special Instructions/Symptoms: Your throat may feel dry or sore from the anesthesia or the breathing tube placed in your throat during surgery. If this causes discomfort, gargle with warm salt water. The discomfort should disappear within 24 hours.  If you had a scopolamine patch placed behind your ear for the management of post- operative nausea and/or vomiting:  1. The medication in the patch is effective for 72 hours, after which it should be removed.  Wrap patch in a tissue and discard in the trash. Wash hands thoroughly with soap and water. 2. You may remove the patch earlier than 72 hours if you experience unpleasant side effects which may include dry mouth, dizziness or visual disturbances. 3. Avoid touching the patch. Wash your hands with soap and water after contact with the patch.

## 2019-10-19 NOTE — Interval H&P Note (Signed)
History and Physical Interval Note:  10/19/2019 7:24 AM  Hannah Nicholson  has presented today for surgery, with the diagnosis of left ureteral stone.  The various methods of treatment have been discussed with the patient and family. After consideration of risks, benefits and other options for treatment, the patient has consented to  Procedure(s): CYSTOSCOPY LEFT URETEROSCOPY/HOLMIUM LASER/STENT PLACEMENT (Left) as a surgical intervention.  The patient's history has been reviewed, patient examined, no change in status, stable for surgery.  I have reviewed the patient's chart and labs.  Questions were answered to the patient's satisfaction.     Kathlen Sakurai D Cliffie Gingras

## 2019-10-19 NOTE — Anesthesia Postprocedure Evaluation (Signed)
Anesthesia Post Note  Patient: Hannah Nicholson  Procedure(s) Performed: CYSTOSCOPY LEFT URETEROSCOPY/HOLMIUM LASER/STENT EXHANGE (Left Pelvis)     Patient location during evaluation: PACU Anesthesia Type: General Level of consciousness: awake and alert Pain management: pain level controlled Vital Signs Assessment: post-procedure vital signs reviewed and stable Respiratory status: spontaneous breathing, nonlabored ventilation, respiratory function stable and patient connected to nasal cannula oxygen Cardiovascular status: blood pressure returned to baseline and stable Postop Assessment: no apparent nausea or vomiting Anesthetic complications: no    Last Vitals:  Vitals:   10/19/19 1000 10/19/19 1040  BP: 138/81 133/76  Pulse: 70 76  Resp: 13 16  Temp:  36.7 C  SpO2: 94% 97%    Last Pain:  Vitals:   10/19/19 1040  TempSrc: Oral  PainSc: 0-No pain                 Tiajuana Amass

## 2019-10-19 NOTE — Anesthesia Procedure Notes (Signed)
Procedure Name: LMA Insertion Date/Time: 10/19/2019 7:46 AM Performed by: Bonney Aid, CRNA Pre-anesthesia Checklist: Patient identified, Emergency Drugs available, Suction available and Patient being monitored Patient Re-evaluated:Patient Re-evaluated prior to induction Oxygen Delivery Method: Circle system utilized Preoxygenation: Pre-oxygenation with 100% oxygen Induction Type: IV induction Ventilation: Mask ventilation without difficulty LMA: LMA inserted LMA Size: 4.0 Number of attempts: 1 Airway Equipment and Method: Bite block Placement Confirmation: positive ETCO2 Dental Injury: Teeth and Oropharynx as per pre-operative assessment

## 2019-10-19 NOTE — Transfer of Care (Signed)
Immediate Anesthesia Transfer of Care Note  Patient: Hannah Nicholson  Procedure(s) Performed: CYSTOSCOPY LEFT URETEROSCOPY/HOLMIUM LASER/STENT EXHANGE (Left Pelvis)  Patient Location: PACU  Anesthesia Type:General  Level of Consciousness: awake, alert  and oriented  Airway & Oxygen Therapy: Patient Spontanous Breathing and Patient connected to nasal cannula oxygen  Post-op Assessment: Report given to RN  Post vital signs: Reviewed and stable  Last Vitals:  Vitals Value Taken Time  BP 147/84 10/19/19 0854  Temp    Pulse 88 10/19/19 0856  Resp 20 10/19/19 0856  SpO2 93 % 10/19/19 0856  Vitals shown include unvalidated device data.  Last Pain:  Vitals:   10/19/19 0615  TempSrc: Oral  PainSc: 6       Patients Stated Pain Goal: 2 ([id number])  Complications: No apparent anesthesia complications

## 2019-10-22 ENCOUNTER — Other Ambulatory Visit: Payer: Self-pay | Admitting: *Deleted

## 2019-10-22 DIAGNOSIS — C50419 Malignant neoplasm of upper-outer quadrant of unspecified female breast: Secondary | ICD-10-CM

## 2019-10-25 NOTE — Progress Notes (Signed)
[place]  [phone] [phone]     ID: Hannah Nicholson DOB: [birth year]  MR#: [id number]  TMH#:[id number]  Patient Care Team: Lawerance Cruel, MD as PCP - General (Family Medicine) Chauncey Cruel, MD OTHER MD:  CHIEF COMPLAINT: hx of left breast cancer, s/p mastectomy  CURRENT TREATMENT: Observation   INTERVAL HISTORY: [name] was evaluated in the breast cancer clinic on 10/26/2019 accompanied by her husband Ed. We reviewed the history that I had available, which is summarized below.    She tells me that after her left breast biopsy at [facility] [place] in 2016 she was treated neoadjuvantly with paclitaxel and trastuzumab for 3 months, followed by left mastectomy, followed by an additional 9 months of trastuzumab.  She did not receive antiestrogens.  Her most recent right diagnostic mammography with tomography at [facility] on 02/04/2019 showed: breast density category C; no evidence of malignancy.    REVIEW OF SYSTEMS: The patient denies unusual headaches, visual changes, nausea, vomiting, stiff neck, dizziness, or gait imbalance. There has been no cough, phlegm production, or pleurisy, no chest pain or pressure, and no change in bowel or bladder habits. The patient denies fever, rash, bleeding, unexplained fatigue or unexplained weight loss.  Before the pandemic she used to go to the gym frequently, after she exercises by walking about 2-1/2 miles at a time, 3 or 4 days a week.  A detailed review of systems was otherwise entirely negative.  HISTORY OF CURRENT ILLNESS: From Dr. Herbie Baltimore Murray's note 11/27/2011  "Hannah Nicholson is a 69 y.o. female who is seen today at the [facility] 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 At the time of a screening mammogram on 11/04/2011 she was noted to have calcifications in the left breast for which a diagnostic mammogram was  obtained on 11/15/2011 showing worrisome calcifications in the lateral posterior left breast suspicious for DCIS. Stereotactic biopsy on 11/15/2011 was diagnostic for high-grade DCIS with necrosis with a microscopic focus of invasive ductal carcinoma. The focus was less than 0.1 cm. The prognostic profile showed your to be 5%, PR to be 0 with a proliferation marker of 22%. HER-2/neu was amplified at 4.75. I'm not sure if this was based on the DCIS and/or microscopic focus of invasive ductal carcinoma. Breast MR on 11/26/2011 showed a 1.0 x 1.0 x 0.9 cm area of low-grade enhancement surrounding the biopsy marker deep in the upper outer quadrant of the left breast. There was also a vertically oriented linear biopsy tract, more posteriorly medially measuring 3.1 x 1.2 x 1.1 cm."  From Dr. Allayne Gitelman office visit at [facility] 04/05/2015:  "History of LEFT breast T67mc, NO, ER 5%, N0, HER 2 neu amplified by [facility](per [facility] office note) in 2013 treated [place] [place] and radiation x 6 weeks. No adjuvant endocrine therapy. Pathology: Sentinel nodes 2,3,4 negative and LEFT axillary lymph node dissection no lymphoid tissue identified. LEFT breast lumpectomy no invasive carcinoma or microinvasive identified. Residual DCIS, grade 3, 1 mm size, all margins uninvolved. It is 1 mm from the anterior margin. Focal colonization of lobules with DCIS is present. Classic LCIS seen in specimen  The patient initially underwent screening mammogram on 01/19/15 at OSH, showing cluster of calcification at 100. Diagnostic imaging was performed on 01/19/15. Findings confirmed atypical lobular hyperplasia . [facility] biopsy [place] [facility] [place]26[place]15 with clip placed.  Pathology findings were the following:  Focal atypical lobular hyperplasia "  The patient's subsequent history is as detailed below.   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   Sleep disturbance    Stress reaction    Unspecified asthma(493.90)    Vaginal atrophy    Vitamin D deficiency     PAST SURGICAL HISTORY: Past Surgical History:  Procedure Laterality Date   BREAST LUMPECTOMY Left 2013   BREAST CANCER AND RADIATION, NO CHEMO   BREAST SURGERY Left 2017   chemo done in 2017, mastectomy   CHOLECYSTECTOMY     COLONOSCOPY     TUBULAR ADENOMA   CYSTOSCOPY W/ URETERAL STENT PLACEMENT Left 09/20/2019   Procedure: CYSTOSCOPY WITH RETROGRADE PYELOGRAM/URETERAL STENT PLACEMENT;  Surgeon: Robley Fries, MD;  Location: [facility];  Service: Urology;  Laterality: Left;   CYSTOSCOPY/URETEROSCOPY/HOLMIUM LASER/STENT PLACEMENT Left 10/19/2019   Procedure: CYSTOSCOPY LEFT URETEROSCOPY/HOLMIUM LASER/STENT EXHANGE;  Surgeon: Robley Fries, MD;  Location: [facility];  Service: Urology;  Laterality: Left;   HEMORRHOID SURGERY     LAPAROSCOPIC INGUINAL HERNIA WITH UMBILICAL HERNIA     MASTECTOMY Left 2016   sinus surgery     TONSILLECTOMY     TOTAL ABDOMINAL HYSTERECTOMY     partial   TUBAL LIGATION      FAMILY HISTORY: Family History  Problem Relation Age of Onset   Asthma Father    Heart disease Mother    Prostate cancer Brother 53   Prostate cancer Maternal Uncle        dx 22s   Lupus Sister    Breast  cancer Sister 49   Stroke Sister 52   Prostate cancer Maternal Uncle        dx 28s   Lung cancer Cousin        thought to be due to sarcoidosis   The patient was diagnosed with breast cancer at age 42.  She was diagnosed with a goiter in her 63s, thyroid nodules at age 53, uterine fibroids for which she had a hysterectomy in her late 8s, and has fibroadenoma's of the breast in the past.  She has seven sisters and one brother.  One sister died of lupus, a sister, Mena Pauls, was diagnosed with breast cancer at age 47 and died at 35, and another sister, Jeannett Senior, died of a stroke at age 53.  Her brother was diagnosed with prostate cancer at age 42.  Her mother died at age 65 of old age.  She had three brothers and four sisters.  Two brothers had prostate cancer, and an aunt's daughter was diagnosed with lung cancer, thought to be due to sarcoidosis.  There is no other reported cancer on either side of  the family.  Patient's maternal ancestors are of African American descent, and paternal ancestors are of African American descent. There is no reported Ashkenazi Jewish ancestry. There is no known consanguinity.  Genetics testing at [facility] [place] found the patient to be BRCA1 and BRCA2 negative.  GYNECOLOGIC HISTORY:  No LMP recorded. Patient has had a hysterectomy. Menarche: 69 years old Age at first live birth: 69 years old [place] P 4 LMP with hysterectomy in her mid 42s HRT no  Hysterectomy? Yes, 1995 BSO?  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  Her husband Ed worked as Engineering geologist, now works part-time for a [employer].  Their oldest son Torino died from liver problems.  Son Sharen Counter this in [place] where he works as a Leisure centre manager and also does a.  Son Julio Sicks used to own his own gym in [place] but now is disabled.  The youngest son Gray Bernhardt, 8 years old, currently lives at home with the  patient.    ADVANCED DIRECTIVES: In the absence of any documentation to the contrary, the patient's spouse is their HCPOA.    HEALTH MAINTENANCE: Social History   Tobacco Use   Smoking status: Never Smoker   Smokeless tobacco: Never Used  Substance Use Topics   Alcohol use: No   Drug use: No     Colonoscopy: 04/2017, repeat 2023  PAP: 2012  Bone density: Overdue   Allergies  Allergen Reactions   Iodine Anaphylaxis and Other (See Comments)    Passes out, IV dye   Ivp Dye [Iodinated Diagnostic Agents] Anaphylaxis and Other (See Comments)    Passes out   Amlodipine Besylate     MAKES BLOOD SUGAR GO UP   Hydrochlorothiazide     GOUT   Lisinopril Cough    Current Outpatient Medications  Medication Sig Dispense Refill   calcium carbonate (OS-CAL - DOSED IN MG OF ELEMENTAL CALCIUM) 1250 (500 Ca) MG tablet Take 1 tablet by mouth daily with breakfast. Not taking     Cholecalciferol (VITAMIN D-3) 5000 UNITS TABS Take 1 tablet by mouth daily.     metFORMIN (GLUCOPHAGE-XR) 500 MG 24 hr tablet Take 1,000 mg by mouth 2 (two) times daily.     sertraline (ZOLOFT) 50 MG tablet Take 50 mg by mouth daily.     SYNTHROID 50 MCG tablet Take 50 mcg by mouth daily.     telmisartan (MICARDIS) 80 MG tablet Take 1 tablet by mouth daily. Not taking     traMADol (ULTRAM) 50 MG tablet Take 1 tablet (50 mg total) by mouth every 6 (six) hours as needed for severe pain. 20 tablet 0   vitamin B-12 (CYANOCOBALAMIN) 1000 MCG tablet Take 1,000 mcg by mouth daily.     fexofenadine (ALLEGRA) 180 MG tablet Take 180 mg by mouth daily as needed for allergies.      hydrochlorothiazide (HYDRODIURIL) 25 MG tablet Take 1 tablet by mouth daily as needed (fluid).      indomethacin (INDOCIN) 50 MG capsule Take 50 mg by mouth 2 (two) times daily as needed.     metoprolol succinate (TOPROL-XL) 25 MG 24 hr tablet Take 1 tablet by mouth daily.     tamsulosin (FLOMAX) 0.4 MG CAPS capsule Take 1  capsule (0.4 mg total) by mouth daily. (Patient not taking: Reported on 10/26/2019) 30 capsule 0   No current facility-administered medications for this visit.  OBJECTIVE: African-American woman who appears younger than stated age  33:   10/26/19 1551  BP: (!) 156/90  Pulse: 69  Resp: 20  Temp: 98.7 F (37.1 C)  SpO2: 99%     Body mass index is 32.44 kg/m.   Wt Readings from Last 3 Encounters:  10/26/19 184 lb 9.6 oz (83.7 kg)  10/19/19 184 lb 12.8 oz (83.8 kg)  09/20/19 189 lb (85.7 kg)      ECOG FS:1 - Symptomatic but completely ambulatory  Ocular: Sclerae unicteric, pupils round and equal Ear-nose-throat: Wearing a mask Lymphatic: No cervical or supraclavicular adenopathy Lungs no rales or rhonchi Heart regular rate and rhythm Abd soft, nontender, positive bowel sounds MSK no focal spinal tenderness, no joint edema Neuro: non-focal, well-oriented, appropriate affect Breasts: I do not palpate any suspicious masses in the right breast and there are no skin or nipple changes of concern.  The left breast is status post mastectomy with no evidence of chest wall recurrence.  Both axillae are benign.   LAB RESULTS:  CMP     Component Value Date/Time   NA 140 10/26/2019 1539   K 4.0 10/26/2019 1539   CL 106 10/26/2019 1539   CO2 22 10/26/2019 1539   GLUCOSE 152 (H) 10/26/2019 1539   BUN 20 10/26/2019 1539   CREATININE 0.96 10/26/2019 1539   CALCIUM 9.2 10/26/2019 1539   PROT 6.8 10/26/2019 1539   ALBUMIN 4.1 10/26/2019 1539   AST 23 10/26/2019 1539   ALT 33 10/26/2019 1539   ALKPHOS 94 10/26/2019 1539   BILITOT 0.3 10/26/2019 1539   GFRNONAA >60 10/26/2019 1539   GFRAA >60 10/26/2019 1539    No results found for: TOTALPROTELP, ALBUMINELP, A1GS, A2GS, BETS, BETA2SER, GAMS, MSPIKE, SPEI  Lab Results  Component Value Date   WBC 4.1 10/26/2019   NEUTROABS 2.1 10/26/2019   HGB 12.5 10/26/2019   HCT 37.3 10/26/2019   MCV 91.6 10/26/2019   PLT 226  10/26/2019    No results found for: LABCA2  No components found for: IWLNLG921  No results for input(s): INR in the last 168 hours.  No results found for: LABCA2  No results found for: JHE174  No results found for: YCX448  No results found for: JEH631  No results found for: CA2729  No components found for: HGQUANT  No results found for: CEA1 / No results found for: CEA1   No results found for: AFPTUMOR  No results found for: CHROMOGRNA  No results found for: KPAFRELGTCHN, LAMBDASER, KAPLAMBRATIO (kappa/lambda light chains)  No results found for: HGBA, HGBA2QUANT, HGBFQUANT, HGBSQUAN (Hemoglobinopathy evaluation)   No results found for: LDH  No results found for: IRON, TIBC, IRONPCTSAT (Iron and TIBC)  No results found for: FERRITIN  Urinalysis    Component Value Date/Time   COLORURINE YELLOW 09/20/2019 1846   APPEARANCEUR CLEAR 09/20/2019 1846   LABSPEC <1.005 (L) 09/20/2019 1846   PHURINE 6.0 09/20/2019 1846   GLUCOSEU NEGATIVE 09/20/2019 1846   HGBUR SMALL (A) 09/20/2019 1846   BILIRUBINUR NEGATIVE 09/20/2019 1846   KETONESUR NEGATIVE 09/20/2019 1846   PROTEINUR NEGATIVE 09/20/2019 1846   UROBILINOGEN 0.2 07/17/2013 1520   NITRITE POSITIVE (A) 09/20/2019 1846   LEUKOCYTESUR SMALL (A) 09/20/2019 1846     STUDIES:  Case Report Surgical Pathology Report Case: [id number] Authorizing Provider:Eun-Sil Juliet Rude, MDCollected: 03/24/2015  [facility]:03/29/2015 [place] Pathologist: Karie Soda, MD  Specimens: A) - Lymph Node, [id number]  B) - Breast, Left,  [id number]    DIAGNOSIS A)Outside case, [id number], [facility] at [facility], [place], [place]. Date of procedure 01-23-12:  A-C.Lymph node, left axillary, sentinel #2-4 (excisional biopsy):  Single lymph node is negative for metastatic carcinoma (0 of 1).   D.Axilla, left (dissection):  Negative for carcinoma.  Lymph node tissue is not identified.   E.Breast, left (excision):  Focus (1 mm) of high-grade ductal carcinoma in situ is identified, associated with biopsy site changes.  Negative for invasive carcinoma.  Additional findings include classic lobular carcinoma in situ in a background of proliferative fibrocystic changes.  Surgical resection margins are negative for carcinoma in situ, with closest anterior (by 0.7 mm) margin.   F.Breast, left, inferior margin (re-excision):  Negative for carcinoma.   B)Outside case, [phone], [facility] at [facility], [place], [place].Date of procedure 01-26-15:  A, B.Breast, left, 2:00, no calcifications (needle core biopsy):  Atypical lobular hyperplasia in a background of mild usual ductal hyperplasia, fibroadenomatoid change, and associated microcalcifications.  Rare, detached clusters of atypical cells present; see Diagnostic Comment.   C.Breast, left, calcifications (needle core biopsy):  Focus of periductal chronic inflammation, fibrosis, and associated luminal microcalcification.  No definite atypia or carcinoma identified.   Diagnostic Comment:The left breast 2:00 core biopsy ([id number] Part A) demonstrate scant, cohesive groups of atypical cells in blood.Although cytologically atypical, the limited and detached nature of these groups preclude definite characterization.   Clinical Information Carcinoma in situ.   Gross Examination Outside case  A:"[id number]" Date of surgery:01-23-12 Number of stained slides: 39; (A1L3, A2 L1, A2 L5, B1 L1, C1 L5, D1 L5 thru D5 L5, E1-E10,Recut E10, E11-E16, E17 w/3 IHC, E18, E19, F1-F6)  Number of blocks:0 Number of unstained slides: 0  Outside case B:"[id number]" Date of surgery:01-26-15 Number of stained slides: 12; (A1 w/2 IHC, A2 L2 w/2 IHC, B1 L2 w/2 IHC, C1 L2 w/2 IHC) Number of blocks:0 Number of unstained slides: 0  Outside path report received?Yes Material to be returned? Yes  Received from:   [place]  at [facility] Department of Pathology [address] [place], [place] Tel: [phone]  Fax: [phone]   Microscopic Examination Microscopic examination is performed.   Disclaimer All immunohistochemical tests performed at [facility] and reported herein were developed, validated and their performance characteristics determined by the Immunopathology Laboratory, [facility]. During the performance of these tests, appropriate positive and negative control slides are also run and reviewed. All control slides and internal controls (when applicable) demonstrate the expected immunoreactive patterns. Some of the tests may not be cleared or approved by the U.S. Food and Drug Administration (FDA).The FDA has determined that such clearance or approval is not necessary.These tests are used for clinical purposes andshould not be regarded as investigational or as research.This laboratory is certified under the [place] (CLIA) as qualified to perform high complexity clinical testing.   Attestation All of the diagnostic evaluations on the enumerated  specimens have been personally conducted by the pathologists involved in the care of this patient as indicated by the electronic signatures above.   Specimen Collected on  Tissue-Pathology - Breast, Left Unknown    ELIGIBLE FOR AVAILABLE RESEARCH PROTOCOL: no  ASSESSMENT: 69 y.o. [place] woman, status post left lumpectomy and sentinel lymph node sampling 01/23/2012 at [facility] [place] for a pT1(mic) pN0, stage IA invasive ductal breast cancer, with also  DCIS present, estrogen receptor 5% positive, HER-2 amplified, with negative margins  (a) a total of 3 left axillary lymph nodes removed  (b) adjuvant radiation x6 weeks  (c) no antiestrogens  (1) status post neoadjuvant paclitaxel and trastuzumab x3 months, then adjuvant trastuzumab for an additional 9 months   (a) left mastectomy at [facility] 01/26/2015 showed only residual atypical lobular hyperplasia (pT0 pN0)   (b) a single left axillary lymph node was removed  (2) genetics testing considered July 2013 but patient's risk felt not to be elevated enough for testing (KP)  (a) testing at [facility] reportedly negative (specifically BRCA1 and BRCA2 negative).   PLAN: Meighan had what appears to have been two different breast cancers in the left breast and we reviewed them separately today.  The first one was microscopic, and required no systemic treatment.  She received adjuvant radiation appropriately.  The second cancer was also invasive but larger, though still stage I.  She was treated with neoadjuvant chemotherapy (Taxol and Herceptin) for 3 months, followed by definitive surgery, which  showed only residual atypical lobular hyperplasia, with the single lymph node clear.  This was a complete pathologic response and it predicts an excellent long-term prognosis.  On that basis and considering that just about 5 years have passed since the surgery with no evidence of recurrence I told her that most likely she is cured of that  cancer  She does have of course remaining right breast.  Her chance of a new breast cancer developing in the right breast is approximately half percent per year.  If she lives another 20 years, which is very reasonable that would be a 10% chance.  This would not qualify her for yearly screening with breast MRI.  We also discussed the possibility of false positives with breast MRI  Accordingly as far as follow-up is concerned all she needs is a yearly mammography and a yearly physician breast and chest wall exam.  At this point I frequently release my patients to their primary care physician and I would be comfortable doing that in Nettey's case as well.  However she likes the idea of continuing oncologic follow-up.  She is an excellent candidate for our survivorship program.  I have set her up for a bone density scan at the same time as her mammogram in August and she will see my nurse practitioner in September for a survivorship visit.  At that point Nellie can choose whether to have a yearly survivorship visit, see me 6 months later, or simply return to Dr. Harrington Challenger, all of those being very adequate options.  Total encounter time 65 minutes.Sarajane Jews C. Lynna Zamorano, MD 10/26/2019 5:05 PM Medical Oncology and Hematology [facility] [place], [place] [address] Tel. [phone]    Fax. [phone]   This document serves as a record of services personally performed by Lurline Del, MD. It was created on his behalf by Wilburn Mylar, a trained medical scribe. The creation of this record is based on the scribe's personal observations and the provider's statements to them.   I, Lurline Del MD, have reviewed the above documentation for accuracy and completeness, and I agree with the above.    *Total Encounter Time as defined by the Centers for Medicare and Medicaid Services includes, in addition to the face-to-face time of a patient visit (documented in the note  above) non-face-to-face time: obtaining and reviewing outside history, ordering and reviewing medications, tests or procedures, care coordination (communications  with other health care professionals or caregivers) and documentation in the medical record.

## 2019-10-26 ENCOUNTER — Inpatient Hospital Stay: Payer: Medicare HMO | Attending: Oncology | Admitting: Oncology

## 2019-10-26 ENCOUNTER — Other Ambulatory Visit: Payer: Self-pay

## 2019-10-26 ENCOUNTER — Other Ambulatory Visit: Payer: Self-pay | Admitting: *Deleted

## 2019-10-26 ENCOUNTER — Inpatient Hospital Stay: Payer: Medicare HMO

## 2019-10-26 VITALS — BP 156/90 | HR 69 | Temp 98.7°F | Resp 20 | Ht 63.25 in | Wt 184.6 lb

## 2019-10-26 DIAGNOSIS — Z9221 Personal history of antineoplastic chemotherapy: Secondary | ICD-10-CM | POA: Diagnosis not present

## 2019-10-26 DIAGNOSIS — Z801 Family history of malignant neoplasm of trachea, bronchus and lung: Secondary | ICD-10-CM | POA: Insufficient documentation

## 2019-10-26 DIAGNOSIS — C50412 Malignant neoplasm of upper-outer quadrant of left female breast: Secondary | ICD-10-CM | POA: Diagnosis not present

## 2019-10-26 DIAGNOSIS — D869 Sarcoidosis, unspecified: Secondary | ICD-10-CM

## 2019-10-26 DIAGNOSIS — Z8042 Family history of malignant neoplasm of prostate: Secondary | ICD-10-CM | POA: Insufficient documentation

## 2019-10-26 DIAGNOSIS — C50419 Malignant neoplasm of upper-outer quadrant of unspecified female breast: Secondary | ICD-10-CM

## 2019-10-26 DIAGNOSIS — Z803 Family history of malignant neoplasm of breast: Secondary | ICD-10-CM | POA: Diagnosis not present

## 2019-10-26 DIAGNOSIS — Z853 Personal history of malignant neoplasm of breast: Secondary | ICD-10-CM | POA: Insufficient documentation

## 2019-10-26 DIAGNOSIS — Z923 Personal history of irradiation: Secondary | ICD-10-CM | POA: Insufficient documentation

## 2019-10-26 DIAGNOSIS — Z170 Estrogen receptor positive status [ER+]: Secondary | ICD-10-CM | POA: Diagnosis not present

## 2019-10-26 DIAGNOSIS — Z9012 Acquired absence of left breast and nipple: Secondary | ICD-10-CM | POA: Insufficient documentation

## 2019-10-26 LAB — CBC WITH DIFFERENTIAL (CANCER CENTER ONLY)
Abs Immature Granulocytes: 0.03 10*3/uL (ref 0.00–0.07)
Basophils Absolute: 0.1 10*3/uL (ref 0.0–0.1)
Basophils Relative: 1 %
Eosinophils Absolute: 0.1 10*3/uL (ref 0.0–0.5)
Eosinophils Relative: 3 %
HCT: 37.3 % (ref 36.0–46.0)
Hemoglobin: 12.5 g/dL (ref 12.0–15.0)
Immature Granulocytes: 1 %
Lymphocytes Relative: 35 %
Lymphs Abs: 1.4 10*3/uL (ref 0.7–4.0)
MCH: 30.7 pg (ref 26.0–34.0)
MCHC: 33.5 g/dL (ref 30.0–36.0)
MCV: 91.6 fL (ref 80.0–100.0)
Monocytes Absolute: 0.3 10*3/uL (ref 0.1–1.0)
Monocytes Relative: 8 %
Neutro Abs: 2.1 10*3/uL (ref 1.7–7.7)
Neutrophils Relative %: 52 %
Platelet Count: 226 10*3/uL (ref 150–400)
RBC: 4.07 MIL/uL (ref 3.87–5.11)
RDW: 12.5 % (ref 11.5–15.5)
WBC Count: 4.1 10*3/uL (ref 4.0–10.5)
nRBC: 0 % (ref 0.0–0.2)

## 2019-10-26 LAB — CMP (CANCER CENTER ONLY)
ALT: 33 U/L (ref 0–44)
AST: 23 U/L (ref 15–41)
Albumin: 4.1 g/dL (ref 3.5–5.0)
Alkaline Phosphatase: 94 U/L (ref 38–126)
Anion gap: 12 (ref 5–15)
BUN: 20 mg/dL (ref 8–23)
CO2: 22 mmol/L (ref 22–32)
Calcium: 9.2 mg/dL (ref 8.9–10.3)
Chloride: 106 mmol/L (ref 98–111)
Creatinine: 0.96 mg/dL (ref 0.44–1.00)
GFR, Est AFR Am: >60 mL/min (ref >60)
GFR, Estimated: >60 mL/min (ref >60)
Glucose, Bld: 152 mg/dL — ABNORMAL HIGH (ref 70–99)
Potassium: 4 mmol/L (ref 3.5–5.1)
Sodium: 140 mmol/L (ref 135–145)
Total Bilirubin: 0.3 mg/dL (ref 0.3–1.2)
Total Protein: 6.8 g/dL (ref 6.5–8.1)

## 2019-10-27 ENCOUNTER — Telehealth: Payer: Self-pay | Admitting: Oncology

## 2019-10-27 NOTE — Telephone Encounter (Signed)
Scheduled appts per 6/1 los. Pt confirmed appt dates and times.

## 2019-11-01 DIAGNOSIS — E119 Type 2 diabetes mellitus without complications: Secondary | ICD-10-CM | POA: Diagnosis not present

## 2019-11-01 DIAGNOSIS — E538 Deficiency of other specified B group vitamins: Secondary | ICD-10-CM | POA: Diagnosis not present

## 2019-11-01 DIAGNOSIS — E041 Nontoxic single thyroid nodule: Secondary | ICD-10-CM | POA: Diagnosis not present

## 2019-11-01 DIAGNOSIS — E039 Hypothyroidism, unspecified: Secondary | ICD-10-CM | POA: Diagnosis not present

## 2019-11-01 DIAGNOSIS — D509 Iron deficiency anemia, unspecified: Secondary | ICD-10-CM | POA: Diagnosis not present

## 2019-11-01 DIAGNOSIS — E559 Vitamin D deficiency, unspecified: Secondary | ICD-10-CM | POA: Diagnosis not present

## 2019-11-03 ENCOUNTER — Other Ambulatory Visit: Payer: Self-pay | Admitting: Oncology

## 2019-11-18 DIAGNOSIS — R350 Frequency of micturition: Secondary | ICD-10-CM | POA: Diagnosis not present

## 2019-11-18 DIAGNOSIS — R3915 Urgency of urination: Secondary | ICD-10-CM | POA: Diagnosis not present

## 2019-11-18 DIAGNOSIS — N200 Calculus of kidney: Secondary | ICD-10-CM | POA: Diagnosis not present

## 2019-11-24 DIAGNOSIS — R1310 Dysphagia, unspecified: Secondary | ICD-10-CM | POA: Diagnosis not present

## 2019-11-24 DIAGNOSIS — K219 Gastro-esophageal reflux disease without esophagitis: Secondary | ICD-10-CM | POA: Diagnosis not present

## 2019-11-24 DIAGNOSIS — E049 Nontoxic goiter, unspecified: Secondary | ICD-10-CM | POA: Diagnosis not present

## 2019-11-24 DIAGNOSIS — K224 Dyskinesia of esophagus: Secondary | ICD-10-CM | POA: Diagnosis not present

## 2019-11-25 DIAGNOSIS — E559 Vitamin D deficiency, unspecified: Secondary | ICD-10-CM | POA: Diagnosis not present

## 2019-11-25 DIAGNOSIS — R5383 Other fatigue: Secondary | ICD-10-CM | POA: Diagnosis not present

## 2019-11-25 DIAGNOSIS — I10 Essential (primary) hypertension: Secondary | ICD-10-CM | POA: Diagnosis not present

## 2019-11-30 DIAGNOSIS — R69 Illness, unspecified: Secondary | ICD-10-CM | POA: Diagnosis not present

## 2019-12-12 DIAGNOSIS — M109 Gout, unspecified: Secondary | ICD-10-CM | POA: Diagnosis not present

## 2020-01-24 ENCOUNTER — Telehealth: Payer: Self-pay | Admitting: Adult Health

## 2020-01-24 NOTE — Telephone Encounter (Signed)
Rescheduled 9/8 appt per LC's hand written instructions. LC will be out of the office on 9/8. Left voicemail with cancellation details and new appt date and time.

## 2020-01-28 DIAGNOSIS — E559 Vitamin D deficiency, unspecified: Secondary | ICD-10-CM | POA: Diagnosis not present

## 2020-01-28 DIAGNOSIS — E039 Hypothyroidism, unspecified: Secondary | ICD-10-CM | POA: Diagnosis not present

## 2020-02-02 ENCOUNTER — Encounter: Payer: Medicare HMO | Admitting: Adult Health

## 2020-02-03 DIAGNOSIS — E559 Vitamin D deficiency, unspecified: Secondary | ICD-10-CM | POA: Diagnosis not present

## 2020-02-03 DIAGNOSIS — E039 Hypothyroidism, unspecified: Secondary | ICD-10-CM | POA: Diagnosis not present

## 2020-02-03 DIAGNOSIS — E119 Type 2 diabetes mellitus without complications: Secondary | ICD-10-CM | POA: Diagnosis not present

## 2020-02-03 DIAGNOSIS — E041 Nontoxic single thyroid nodule: Secondary | ICD-10-CM | POA: Diagnosis not present

## 2020-02-09 ENCOUNTER — Ambulatory Visit
Admission: RE | Admit: 2020-02-09 | Discharge: 2020-02-09 | Disposition: A | Discharge status: Home / Self Care | Payer: Medicare HMO | Source: Ambulatory Visit | Attending: Oncology | Admitting: Oncology

## 2020-02-09 ENCOUNTER — Other Ambulatory Visit: Payer: Self-pay

## 2020-02-09 DIAGNOSIS — D869 Sarcoidosis, unspecified: Secondary | ICD-10-CM

## 2020-02-09 DIAGNOSIS — Z170 Estrogen receptor positive status [ER+]: Secondary | ICD-10-CM

## 2020-02-09 DIAGNOSIS — Z1231 Encounter for screening mammogram for malignant neoplasm of breast: Secondary | ICD-10-CM | POA: Diagnosis not present

## 2020-02-09 DIAGNOSIS — C50412 Malignant neoplasm of upper-outer quadrant of left female breast: Secondary | ICD-10-CM

## 2020-02-09 DIAGNOSIS — Z780 Asymptomatic menopausal state: Secondary | ICD-10-CM | POA: Diagnosis not present

## 2020-03-02 ENCOUNTER — Other Ambulatory Visit: Payer: Self-pay | Admitting: Oncology

## 2020-03-02 DIAGNOSIS — E559 Vitamin D deficiency, unspecified: Secondary | ICD-10-CM | POA: Diagnosis not present

## 2020-03-02 DIAGNOSIS — K219 Gastro-esophageal reflux disease without esophagitis: Secondary | ICD-10-CM | POA: Diagnosis not present

## 2020-03-02 DIAGNOSIS — E039 Hypothyroidism, unspecified: Secondary | ICD-10-CM | POA: Diagnosis not present

## 2020-03-02 DIAGNOSIS — R5381 Other malaise: Secondary | ICD-10-CM | POA: Diagnosis not present

## 2020-03-02 DIAGNOSIS — G479 Sleep disorder, unspecified: Secondary | ICD-10-CM | POA: Diagnosis not present

## 2020-03-02 DIAGNOSIS — R69 Illness, unspecified: Secondary | ICD-10-CM | POA: Diagnosis not present

## 2020-03-03 ENCOUNTER — Inpatient Hospital Stay: Payer: Medicare HMO | Admitting: Adult Health

## 2020-03-03 NOTE — Progress Notes (Deleted)
CLINIC:  Survivorship   REASON FOR VISIT:  Routine follow-up for history of breast cancer.   BRIEF ONCOLOGIC HISTORY:  Per Dr. Jana Hakim 10/26/2019 note:   [place] woman, status post left lumpectomy and sentinel lymph node sampling 01/23/2012 at [facility] [place] for a pT1(mic) pN0, stage IA invasive ductal breast cancer, with also DCIS present, estrogen receptor 5% positive, HER-2 amplified, with negative margins             (a) a total of 3 left axillary lymph nodes removed             (b) adjuvant radiation x6 weeks             (c) no antiestrogens  (1) status post neoadjuvant paclitaxel and trastuzumab x3 months, then adjuvant trastuzumab for an additional 9 months              (a) left mastectomy at [facility] 01/26/2015 showed only residual atypical lobular hyperplasia (pT0 pN0)                        (b) a single left axillary lymph node was removed  (2) genetics testing considered July 2013 but patient's risk felt not to be elevated enough for testing (KP)             (a) testing at [facility] reportedly negative (specifically BRCA1 and BRCA2 negative).   INTERVAL HISTORY:  Hannah Nicholson presents to the Survivorship Clinic today for routine follow-up for her history of breast cancer.  Overall, she reports feeling quite well. ***   Her most recent mammogram of the right breast was normal and she had breast density C.  This was completed on 02/11/2020.  She is s/p L breast mastectomy.  She underwent bone density on 02/09/2020 and this was normal.     REVIEW OF SYSTEMS:  Review of Systems - Oncology Breast: Denies any new nodularity, masses, tenderness, nipple changes, or nipple discharge.       PAST MEDICAL/SURGICAL HISTORY:  Past Medical History:  Diagnosis Date  . Allergic rhinitis, cause unspecified   . Anxiety   . Asthma    no current inhaler use in last year  . Chronic kidney disease    kidney stones  . Diverticulitis   . DM type 2 (diabetes  mellitus, type 2) ([place])   . Esophageal reflux   . Gastroesophageal reflux disease without esophagitis   . Gout   . History of colonic polyps   . HTN (hypertension)   . Hypothyroidism   . Low back pain   . Nontoxic single thyroid nodule   . Personal history of malignant neoplasm of breast left  . Pure hypercholesterolemia, unspecified   . 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 Sleep disturbance   . Stress reaction   . Unspecified asthma(493.90)   . Vaginal atrophy   . Vitamin D deficiency    Past Surgical History:  Procedure Laterality Date  . BREAST LUMPECTOMY Left 2013   BREAST CANCER AND RADIATION, NO CHEMO  . BREAST SURGERY Left 2017   chemo done in 2017, mastectomy  . CHOLECYSTECTOMY    . COLONOSCOPY     TUBULAR ADENOMA  . CYSTOSCOPY W/ URETERAL STENT PLACEMENT Left 09/20/2019   Procedure: CYSTOSCOPY WITH RETROGRADE PYELOGRAM/URETERAL STENT PLACEMENT;  Surgeon: Robley Fries, MD;  Location: [facility];  Service: Urology;  Laterality: Left;  . CYSTOSCOPY/URETEROSCOPY/HOLMIUM LASER/STENT PLACEMENT Left 10/19/2019  Procedure: CYSTOSCOPY LEFT URETEROSCOPY/HOLMIUM LASER/STENT EXHANGE;  Surgeon: Robley Fries, MD;  Location[facility];  Service: Urology;  Laterality: Left;  . HEMORRHOID SURGERY    . LAPAROSCOPIC INGUINAL HERNIA WITH UMBILICAL HERNIA    . MASTECTOMY Left 2016  . sinus surgery    . TONSILLECTOMY    . TOTAL ABDOMINAL HYSTERECTOMY     partial  . TUBAL LIGATION       ALLERGIES:  Allergies  Allergen Reactions  . Iodine Anaphylaxis and Other (See Comments)    Passes out, IV dye  . Ivp Dye [Iodinated Diagnostic Agents] Anaphylaxis and Other (See Comments)    Passes out  . Amlodipine Besylate     MAKES BLOOD SUGAR GO UP  . Hydrochlorothiazide     GOUT  . Lisinopril Cough     CURRENT MEDICATIONS:  Outpatient Encounter Medications as of 03/03/2020  Medication Sig  . calcium carbonate (OS-CAL - DOSED IN MG OF ELEMENTAL  CALCIUM) 1250 (500 Ca) MG tablet Take 1 tablet by mouth daily with breakfast. Not taking  . Cholecalciferol (VITAMIN D-3) 5000 UNITS TABS Take 1 tablet by mouth daily.  . fexofenadine (ALLEGRA) 180 MG tablet Take 180 mg by mouth daily as needed for allergies.   . hydrochlorothiazide (HYDRODIURIL) 25 MG tablet Take 1 tablet by mouth daily as needed (fluid).   . indomethacin (INDOCIN) 50 MG capsule Take 50 mg by mouth 2 (two) times daily as needed.  . metFORMIN (GLUCOPHAGE-XR) 500 MG 24 hr tablet Take 1,000 mg by mouth 2 (two) times daily.  . metoprolol succinate (TOPROL-XL) 25 MG 24 hr tablet Take 1 tablet by mouth daily.  . sertraline (ZOLOFT) 50 MG tablet Take 50 mg by mouth daily.  Marland Kitchen SYNTHROID 50 MCG tablet Take 50 mcg by mouth daily.  . tamsulosin (FLOMAX) 0.4 MG CAPS capsule Take 1 capsule (0.4 mg total) by mouth daily. (Patient not taking: Reported on 10/26/2019)  . telmisartan (MICARDIS) 80 MG tablet Take 1 tablet by mouth daily. Not taking  . traMADol (ULTRAM) 50 MG tablet Take 1 tablet (50 mg total) by mouth every 6 (six) hours as needed for severe pain.  . vitamin B-12 (CYANOCOBALAMIN) 1000 MCG tablet Take 1,000 mcg by mouth daily.   No facility-administered encounter medications on file as of 03/03/2020.     ONCOLOGIC FAMILY HISTORY:  Family History  Problem Relation Age of Onset  . Asthma Father   . Heart disease Mother   . Prostate cancer Brother 80  . Prostate cancer Maternal Uncle        dx 20s  . Lupus Sister   . Breast cancer Sister 26  . Stroke Sister 12  . Prostate cancer Maternal Uncle        dx 95s  . Lung cancer Cousin        thought to be due to sarcoidosis    GENETIC COUNSELING/TESTING: ***  SOCIAL HISTORY:  Hannah Nicholson is /single/married/divorced/widowed/separated and lives alone/with her spouse/family/friend in (city), [place].  She has (#) children and they live in (city).  Hannah Nicholson is currently retired/disabled/working  part-time/full-time as ***.  She denies any current or history of tobacco, alcohol, or illicit drug use.     PHYSICAL EXAMINATION:  Vital Signs: There were no vitals filed for this visit. There were no vitals filed for this visit. General: Well-nourished, well-appearing female in no acute distress.  Unaccompanied/Accompanied by***** today.   HEENT: Head is normocephalic.  Pupils equal and reactive to  light. Conjunctivae clear without exudate.  Sclerae anicteric. Oral mucosa is pink, moist.  Oropharynx is pink without lesions or erythema.  Lymph: No cervical, supraclavicular, or infraclavicular lymphadenopathy noted on palpation.  Cardiovascular: Regular rate and rhythm.Marland Kitchen Respiratory: Clear to auscultation bilaterally. Chest expansion symmetric; breathing non-labored.  Breast Exam:  -Left breast: No appreciable masses on palpation. No skin redness, thickening, or peau d'orange appearance; no nipple retraction or nipple discharge; mild distortion in symmetry at previous lumpectomy site***healed scar without erythema or nodularity.  -Right breast: No appreciable masses on palpation. No skin redness, thickening, or peau d'orange appearance; no nipple retraction or nipple discharge; mild distortion in symmetry at previous lumpectomy site***healed scar without erythema or nodularity. -Axilla: No axillary adenopathy bilaterally.  GI: Abdomen soft and round; non-tender, non-distended. Bowel sounds normoactive. No hepatosplenomegaly.   GU: Deferred.  Neuro: No focal deficits. Steady gait.  Psych: Mood and affect normal and appropriate for situation.  MSK: No focal spinal tenderness to palpation, full range of motion in bilateral upper extremities Extremities: No edema. Skin: Warm and dry.  LABORATORY DATA:  None for this visit***   DIAGNOSTIC IMAGING:  Most recent mammogram: CLINICAL DATA:  Screening.  EXAM: DIGITAL SCREENING UNILATERAL RIGHT MAMMOGRAM WITH CAD AND TOMO  COMPARISON:   Previous exam(s).  ACR Breast Density Category c: The breast tissue is heterogeneously dense, which may obscure small masses.  FINDINGS: The patient has had a left mastectomy. There are no findings suspicious for malignancy. Images were processed with CAD.  IMPRESSION: No mammographic evidence of malignancy. A result letter of this screening mammogram will be mailed directly to the patient.  RECOMMENDATION: Screening mammogram in one year.  (Code:SM-R-35M)  BI-RADS CATEGORY  1: Negative.   Electronically Signed   By: Kristopher Oppenheim M.D.   On: 02/11/2020 11:33    ASSESSMENT AND PLAN:  Ms.. Nicholson is a pleasant 69 y.o. female with history of Stage *** right/left breast invasive ductal carcinoma, ER+/PR+/HER2-, diagnosed in (date), treated with lumpectomy, adjuvant radiation therapy, and anti-estrogen therapy with *** beginning in (date).  She presents to the Survivorship Clinic for surveillance and routine follow-up.   1. History of breast cancer:  Hannah Nicholson is currently clinically and radiographically without evidence of disease or recurrence of breast cancer. She will be due for mammogram in ***; orders placed today.  She will continue her anti-estrogen therapy with ***, with plans to continue for *** years.  She will return to the cancer center to see her medical oncologist, Dr. ***, in ***/2018.  I encouraged her to call me with any questions or concerns before her next visit at the cancer center, and I would be happy to see her sooner, if needed.    #. Problem(s) at Visit___________________.  #. Bone health:  Given Hannah Nicholson's age, history of breast cancer, and her current anti-estrogen therapy with ________, she is at risk for bone demineralization. Her last DEXA scan was on **/**/20**.  In the meantime, she was encouraged to increase her consumption of foods rich in calcium, as well as increase her weight-bearing activities.  She was given education on specific food and  activities to promote bone health.  #. Cancer screening:  Due to Hannah Nicholson's history and her age, she should receive screening for skin cancers, colon cancer, and ***gynecologic cancers. She was encouraged to follow-up with her PCP for appropriate cancer screenings.   #. Health maintenance and wellness promotion: Hannah Nicholson was encouraged to consume 5-7 servings of fruits and vegetables  per day. She was also encouraged to engage in moderate to vigorous exercise for 30 minutes per day most days of the week. She was instructed to limit her alcohol consumption and continue to abstain from tobacco use/was encouraged stop smoking.  ***    Dispo:  -Return to cancer center ***   A total of (30) minutes of face-to-face time was spent with this patient with greater than 50% of that time in counseling and care-coordination.   Gardenia Phlegm, NP Survivorship Program [place] [phone]   Note: PRIMARY CARE PROVIDER Lawerance Cruel, [place] [phone]

## 2020-04-05 ENCOUNTER — Inpatient Hospital Stay: Payer: Medicare HMO | Attending: Adult Health | Admitting: Adult Health

## 2020-04-05 ENCOUNTER — Encounter: Payer: Self-pay | Admitting: Adult Health

## 2020-04-05 ENCOUNTER — Other Ambulatory Visit: Payer: Self-pay

## 2020-04-05 ENCOUNTER — Telehealth: Payer: Self-pay | Admitting: Adult Health

## 2020-04-05 VITALS — BP 154/75 | HR 78 | Temp 98.8°F | Resp 18 | Ht 63.25 in | Wt 189.9 lb

## 2020-04-05 DIAGNOSIS — Z803 Family history of malignant neoplasm of breast: Secondary | ICD-10-CM | POA: Diagnosis not present

## 2020-04-05 DIAGNOSIS — Z8042 Family history of malignant neoplasm of prostate: Secondary | ICD-10-CM | POA: Insufficient documentation

## 2020-04-05 DIAGNOSIS — Z923 Personal history of irradiation: Secondary | ICD-10-CM | POA: Diagnosis not present

## 2020-04-05 DIAGNOSIS — Z9221 Personal history of antineoplastic chemotherapy: Secondary | ICD-10-CM | POA: Diagnosis not present

## 2020-04-05 DIAGNOSIS — Z170 Estrogen receptor positive status [ER+]: Secondary | ICD-10-CM | POA: Diagnosis not present

## 2020-04-05 DIAGNOSIS — Z801 Family history of malignant neoplasm of trachea, bronchus and lung: Secondary | ICD-10-CM | POA: Insufficient documentation

## 2020-04-05 DIAGNOSIS — C50412 Malignant neoplasm of upper-outer quadrant of left female breast: Secondary | ICD-10-CM

## 2020-04-05 DIAGNOSIS — Z853 Personal history of malignant neoplasm of breast: Secondary | ICD-10-CM | POA: Insufficient documentation

## 2020-04-05 DIAGNOSIS — Z9012 Acquired absence of left breast and nipple: Secondary | ICD-10-CM | POA: Insufficient documentation

## 2020-04-05 NOTE — Progress Notes (Signed)
[place]  [phone] [phone]     ID: Hannah Nicholson DOB: 19-Dec-1950  MR#: [id number]  JKK#:[id number]  Patient Care Team: Lawerance Cruel, MD as PCP - General (Family Medicine) Magrinat, Virgie Dad, MD as Consulting Physician (Oncology) Jacelyn Pi, MD as Referring Physician (Endocrinology) Clarene Essex, MD as Consulting Physician (Gastroenterology) Scot Dock, NP OTHER MD:  CHIEF COMPLAINT: hx of left breast cancer, s/p mastectomy  CURRENT TREATMENT: Observation   INTERVAL HISTORY: Hannah Nicholson is here today for f/u as a long term survivor.  She is feeling quite well today.  Her only health issue she has been dealing with this year is keeping her diabetes controlled.    Hannah Nicholson continues under observation alone.  Her most recent mammogram was completed on 02/11/2020 and showed no evidence of malignancy and breast density category C.  She also underwent a bone density test at that time that was normal.    Hannah Nicholson wants to know what she can do to improve her libido, and dyspareunia during intercourse.     REVIEW OF SYSTEMS: Hannah Nicholson is overall feeling well.  She has intermittent left upper arm lymphedema that is mild.  She denies any breast or chest wall changes.  She has had some family stress this year, but otherwise denies any concerns.    Hannah Nicholson has no fever, chills, vision changes, headaches, lymphadenopathy, nausea, vomiting, bowel/bladder changes, cough, shortness of breath, chest pain, or palpitations.  A detailed ROS was otherwise non contributory.    HISTORY OF CURRENT ILLNESS: From Dr. Herbie Baltimore Murray's note 11/27/2011  "Hannah Nicholson is a 69 y.o. female who is seen today at the [facility] 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 At the time of a screening mammogram on 11/04/2011 she was noted to have calcifications in the left breast for which a diagnostic mammogram was  obtained on 11/15/2011 showing worrisome calcifications in the lateral posterior left breast suspicious for DCIS. Stereotactic biopsy on 11/15/2011 was diagnostic for high-grade DCIS with necrosis with a microscopic focus of invasive ductal carcinoma. The focus was less than 0.1 cm. The prognostic profile showed your to be 5%, PR to be 0 with a proliferation marker of 22%. HER-2/neu was amplified at 4.75. I'm not sure if this was based on the DCIS and/or microscopic focus of invasive ductal carcinoma. Breast MR on 11/26/2011 showed a 1.0 x 1.0 x 0.9 cm area of low-grade enhancement surrounding the biopsy marker deep in the upper outer quadrant of the left breast. There was also a vertically oriented linear biopsy tract, more posteriorly medially measuring 3.1 x 1.2 x 1.1 cm."  From Dr. Allayne Gitelman office visit at [facility] 04/05/2015:  "History of LEFT breast T14mc, NO, ER 5%, N0, HER 2 neu amplified by FCapulin(per OSH office note) in 2013 treated [place] [facility] [place] and radiation x 6 weeks. No adjuvant endocrine therapy. Pathology: Sentinel nodes 2,3,4 negative and LEFT axillary lymph node dissection no lymphoid tissue identified. LEFT breast lumpectomy no invasive carcinoma or microinvasive identified. Residual DCIS, grade 3, 1 mm size, all margins uninvolved. It is 1 mm from the anterior margin. Focal colonization of lobules with DCIS is present. Classic LCIS seen in specimen  The patient initially underwent screening mammogram on 01/19/15 at OSH, showing cluster of calcification at 100. Diagnostic imaging was performed on 01/19/15. Findings confirmed atypical lobular hyperplasia . Diagnostic biopsy was performed at cancer center  [facility] [place] on 01/26/15 with clip placed.  Pathology findings were the following:  Focal atypical lobular hyperplasia "  The patient's subsequent history is as detailed below.   PAST MEDICAL HISTORY: Past  Medical History:  Diagnosis Date  . Allergic rhinitis, cause unspecified   . Anxiety   . Asthma    no current inhaler use in last year  . Chronic kidney disease    kidney stones  . Diverticulitis   . DM type 2 (diabetes mellitus, type 2) ([place])   . Esophageal reflux   . Gastroesophageal reflux disease without esophagitis   . Gout   . History of colonic polyps   . HTN (hypertension)   . Hypothyroidism   . Low back pain   . Nontoxic single thyroid nodule   . Personal history of malignant neoplasm of breast left  . Pure hypercholesterolemia, unspecified   . 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 Sleep disturbance   . Stress reaction   . Unspecified asthma(493.90)   . Vaginal atrophy   . Vitamin D deficiency     PAST SURGICAL HISTORY: Past Surgical History:  Procedure Laterality Date  . BREAST LUMPECTOMY Left 2013   BREAST CANCER AND RADIATION, NO CHEMO  . BREAST SURGERY Left 2017   chemo done in 2017, mastectomy  . CHOLECYSTECTOMY    . COLONOSCOPY     TUBULAR ADENOMA  . CYSTOSCOPY W/ URETERAL STENT PLACEMENT Left 09/20/2019   Procedure: CYSTOSCOPY WITH RETROGRADE PYELOGRAM/URETERAL STENT PLACEMENT;  Surgeon: Robley Fries, MD;  Location: [facility];  Service: Urology;  Laterality: Left;  . CYSTOSCOPY/URETEROSCOPY/HOLMIUM LASER/STENT PLACEMENT Left 10/19/2019   Procedure: CYSTOSCOPY LEFT URETEROSCOPY/HOLMIUM LASER/STENT EXHANGE;  Surgeon: Robley Fries, MD;  Location[facility];  Service: Urology;  Laterality: Left;  . HEMORRHOID SURGERY    . LAPAROSCOPIC INGUINAL HERNIA WITH UMBILICAL HERNIA    . MASTECTOMY Left 2016  . sinus surgery    . TONSILLECTOMY    . TOTAL ABDOMINAL HYSTERECTOMY     partial  . TUBAL LIGATION      FAMILY HISTORY: Family History  Problem Relation Age of Onset  . Asthma Father   . Heart disease Mother   . Prostate cancer Brother 35  . Prostate cancer Maternal Uncle        dx 72s  . Lupus Sister   . Breast  cancer Sister 13  . Stroke Sister 76  . Prostate cancer Maternal Uncle        dx 64s  . Lung cancer Cousin        thought to be due to sarcoidosis   The patient was diagnosed with breast cancer at age 58.  She was diagnosed with a goiter in her 30s, thyroid nodules at age 72, uterine fibroids for which she had a hysterectomy in her late 41s, and has fibroadenoma's of the breast in the past.  She has seven sisters and one brother.  One sister died of lupus, a sister, Hannah Nicholson, was diagnosed with breast cancer at age 62 and died at 7, and another sister, Hannah Nicholson, died of a stroke at age 40.  Her brother was diagnosed with prostate cancer at age 48.  Her mother died at age 40 of old age.  She had three brothers and four sisters.  Two brothers had prostate cancer, and an aunt's daughter was diagnosed with lung cancer, thought to be due to sarcoidosis.  There is no other reported cancer  on either side of the family.  Patient's maternal ancestors are of African American descent, and paternal ancestors are of African American descent. There is no reported Ashkenazi Jewish ancestry. There is no known consanguinity.  Genetics testing at [facility] treatment [facility] [place] found the patient to be BRCA1 and BRCA2 negative.  GYNECOLOGIC HISTORY:  No LMP recorded. Patient has had a hysterectomy. Menarche: 69 years old Age at first live birth: 70 years old [place] P 4 LMP with hysterectomy in her mid 12s HRT no  Hysterectomy? Yes, 1995 BSO?  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  Her husband Ed worked as Engineering geologist, now works part-time for a [employer].  Their oldest son Hannah Nicholson died from liver problems.  Son Hannah Nicholson this in [place] where he works as a Leisure centre manager and also does a.  Son Hannah Nicholson used to own his own gym [place] but now is disabled.  The youngest son Hannah Nicholson, 64 years old, currently lives at home with the  patient.    ADVANCED DIRECTIVES: In the absence of any documentation to the contrary, the patient's spouse is their HCPOA.    HEALTH MAINTENANCE: Social History   Tobacco Use  . Smoking status: Never Smoker  . Smokeless tobacco: Never Used  Vaping Use  . Vaping Use: Never used  Substance Use Topics  . Alcohol use: No  . Drug use: No     Colonoscopy: 04/2017, repeat 2023  PAP: 2012  Bone density: Overdue   Allergies  Allergen Reactions  . Iodine Anaphylaxis and Other (See Comments)    Passes out, IV dye  . Ivp Dye [Iodinated Diagnostic Agents] Anaphylaxis and Other (See Comments)    Passes out  . Amlodipine Besylate     MAKES BLOOD SUGAR GO UP  . Hydrochlorothiazide     GOUT  . Lisinopril Cough    Current Outpatient Medications  Medication Sig Dispense Refill  . calcium carbonate (OS-CAL - DOSED IN MG OF ELEMENTAL CALCIUM) 1250 (500 Ca) MG tablet Take 1 tablet by mouth daily with breakfast. Not taking    . cyanocobalamin (,VITAMIN B-12,) 1000 MCG/ML injection Inject 1,000 mcg into the muscle every 30 (thirty) days.    . fexofenadine (ALLEGRA) 180 MG tablet Take 180 mg by mouth daily as needed for allergies.     Marland Kitchen glimepiride (AMARYL) 2 MG tablet Take 2 mg by mouth daily.    . hydrochlorothiazide (HYDRODIURIL) 25 MG tablet Take 1 tablet by mouth daily as needed (fluid).     . indomethacin (INDOCIN) 50 MG capsule Take 50 mg by mouth 2 (two) times daily as needed.    . metFORMIN (GLUCOPHAGE-XR) 500 MG 24 hr tablet Take 1,000 mg by mouth 2 (two) times daily.    . metoprolol succinate (TOPROL-XL) 25 MG 24 hr tablet Take 1 tablet by mouth daily.    Glory Rosebush ULTRA test strip 1 each daily.    . pantoprazole (PROTONIX) 40 MG tablet Take 40 mg by mouth daily.    . sertraline (ZOLOFT) 50 MG tablet Take 50 mg by mouth daily.    Marland Kitchen SYNTHROID 50 MCG tablet Take 50 mcg by mouth daily.    Marland Kitchen telmisartan (MICARDIS) 80 MG tablet Take 1 tablet by mouth daily. Not taking    . vitamin  B-12 (CYANOCOBALAMIN) 1000 MCG tablet Take 1,000 mcg by mouth daily.    . Vitamin D,  Ergocalciferol, (DRISDOL) 1.25 MG (50000 UNIT) CAPS capsule Take 50,000 Units by mouth once a week.     No current facility-administered medications for this visit.    OBJECTIVE: Vitals:   04/05/20 0943  BP: (!) 154/75  Pulse: 78  Resp: 18  Temp: 98.8 F (37.1 C)  SpO2: 97%     Body mass index is 33.37 kg/m.   Wt Readings from Last 3 Encounters:  04/05/20 189 lb 14.4 oz (86.1 kg)  10/26/19 184 lb 9.6 oz (83.7 kg)  10/19/19 184 lb 12.8 oz (83.8 kg)  ECOG FS:1 - Symptomatic but completely ambulatory GENERAL: Patient is a well appearing female in no acute distress HEENT:  Sclerae anicteric.  Mask in place. Neck is supple.  NODES:  No cervical, supraclavicular, or axillary lymphadenopathy palpated.  BREAST EXAM:  Left breast s/p mastectomy, no sign of local recurrence, right breast is benign. LUNGS:  Clear to auscultation bilaterally.  No wheezes or rhonchi. HEART:  Regular rate and rhythm. No murmur appreciated. ABDOMEN:  Soft, nontender.  Positive, normoactive bowel sounds. No organomegaly palpated. MSK:  No focal spinal tenderness to palpation. Full range of motion bilaterally in the upper extremities. EXTREMITIES:  No peripheral edema.   SKIN:  Clear with no obvious rashes or skin changes. No nail dyscrasia. NEURO:  Nonfocal. Well oriented.  Appropriate affect.    LAB RESULTS:  CMP     Component Value Date/Time   NA 140 10/26/2019 1539   K 4.0 10/26/2019 1539   CL 106 10/26/2019 1539   CO2 22 10/26/2019 1539   GLUCOSE 152 (H) 10/26/2019 1539   BUN 20 10/26/2019 1539   CREATININE 0.96 10/26/2019 1539   CALCIUM 9.2 10/26/2019 1539   PROT 6.8 10/26/2019 1539   ALBUMIN 4.1 10/26/2019 1539   AST 23 10/26/2019 1539   ALT 33 10/26/2019 1539   ALKPHOS 94 10/26/2019 1539   BILITOT 0.3 10/26/2019 1539   GFRNONAA >60 10/26/2019 1539   GFRAA >60 10/26/2019 1539    No results found for:  TOTALPROTELP, ALBUMINELP, A1GS, A2GS, BETS, BETA2SER, GAMS, MSPIKE, SPEI  Lab Results  Component Value Date   WBC 4.1 10/26/2019   NEUTROABS 2.1 10/26/2019   HGB 12.5 10/26/2019   HCT 37.3 10/26/2019   MCV 91.6 10/26/2019   PLT 226 10/26/2019    No results found for: LABCA2  No components found for: OBSJGG836  No results for input(s): INR in the last 168 hours.  No results found for: LABCA2  No results found for: OQH476  No results found for: LYY503  No results found for: TWS568  No results found for: CA2729  No components found for: HGQUANT  No results found for: CEA1 / No results found for: CEA1   No results found for: AFPTUMOR  No results found for: CHROMOGRNA  No results found for: KPAFRELGTCHN, LAMBDASER, KAPLAMBRATIO (kappa/lambda light chains)  No results found for: HGBA, HGBA2QUANT, HGBFQUANT, HGBSQUAN (Hemoglobinopathy evaluation)   No results found for: LDH  No results found for: IRON, TIBC, IRONPCTSAT (Iron and TIBC)  No results found for: FERRITIN  Urinalysis    Component Value Date/Time   COLORURINE YELLOW 09/20/2019 1846   APPEARANCEUR CLEAR 09/20/2019 1846   LABSPEC <1.005 (L) 09/20/2019 1846   PHURINE 6.0 09/20/2019 1846   GLUCOSEU NEGATIVE 09/20/2019 1846   HGBUR SMALL (A) 09/20/2019 1846   BILIRUBINUR NEGATIVE 09/20/2019 1846   KETONESUR NEGATIVE 09/20/2019 1846   PROTEINUR NEGATIVE 09/20/2019 1846   UROBILINOGEN 0.2 07/17/2013 1520  NITRITE POSITIVE (A) 09/20/2019 1846   LEUKOCYTESUR SMALL (A) 09/20/2019 1846     STUDIES: CLINICAL DATA:  Screening.  EXAM: DIGITAL SCREENING UNILATERAL RIGHT MAMMOGRAM WITH CAD AND TOMO  COMPARISON:  Previous exam(s).  ACR Breast Density Category c: The breast tissue is heterogeneously dense, which may obscure small masses.  FINDINGS: The patient has had a left mastectomy. There are no findings suspicious for malignancy. Images were processed with CAD.  IMPRESSION: No  mammographic evidence of malignancy. A result letter of this screening mammogram will be mailed directly to the patient.  RECOMMENDATION: Screening mammogram in one year.  (Code:[id number]-R-35M)  BI-RADS CATEGORY  1: Negative.   Electronically Signed   By: Kristopher Oppenheim M.D.   On: 02/11/2020 11:33   ELIGIBLE FOR AVAILABLE RESEARCH PROTOCOL: no  ASSESSMENT: 69 y.o. [place] woman, status post left lumpectomy and sentinel lymph node sampling 01/23/2012 at [facility] [place] for a pT1(mic) pN0, stage IA invasive ductal breast cancer, with also DCIS present, estrogen receptor 5% positive, HER-2 amplified, with negative margins  (a) a total of 3 left axillary lymph nodes removed  (b) adjuvant radiation x6 weeks  (c) no antiestrogens  (1) status post neoadjuvant paclitaxel and trastuzumab x3 months, then adjuvant trastuzumab for an additional 9 months   (a) left mastectomy at [facility] 01/26/2015 showed only residual atypical lobular hyperplasia (pT0 pN0)   (b) a single left axillary lymph node was removed  (2) genetics testing considered July 2013 but patient's risk felt not to be elevated enough for testing (KP)  (a) testing at [facility] reportedly negative (specifically BRCA1 and BRCA2 negative).   PLAN: Hannah Nicholson is here today for continued surveillance and monitoring of her h/o left breast cancer treated with chemotherapy and mastectomy.  She has no clinical or radiographic signs of breast cancer recurrence.  She is due for a repeat mammogram in 01/2021.  She was counseled on self breast awareness.    Hannah Nicholson was counseled to continue diet and exercise.  Her bone density is normal, and she can have this repeated in 5 years.    Hannah Nicholson and I discussed her decreased libido and dyspareunia.  I suggested that gentle stimulation for some women may help with libido.  We talked about vaginal dryness, and how that coupled with decreased use of the vagina can lead to vaginal atrophy.   She was recommended to get replens or vitamin E suppositories to help with moisture.  We discussed the use of vaginal dilators for atrophy.  She is going to try these things, and if she notes no improvement she will call me and I can refer her to pelvic rehabilitation. We talked about using vaginal estrogens, however I told her that every other option should be attempted first, as we don't have data on systemic absorption of this.  She agrees.  Acelynn was recommended to continue to f/u with her PCP regularly for health maintenance and to stay up to date with her colon, pelvic, and skin cancer screenings.    We will see her back in one year for continued f/u.  She knows to call for any questions that may arise between now and her next appointment.  We are happy to see her sooner if needed.  Total encounter time: 30 minutes*     Wilber Bihari, NP 04/05/20 10:09 AM Medical Oncology and Hematology [facility] [place], [place] [address] Tel. [phone]    Fax. [phone]    *Total  Encounter Time as defined by the Centers for Medicare and Medicaid Services includes, in addition to the face-to-face time of a patient visit (documented in the note above) non-face-to-face time: obtaining and reviewing outside history, ordering and reviewing medications, tests or procedures, care coordination (communications with other health care professionals or caregivers) and documentation in the medical record.

## 2020-04-05 NOTE — Telephone Encounter (Signed)
Scheduled appointment per 11/10 los. Spoke to patient who is aware of appointment date and time. Gave patient calendar print out.

## 2020-04-13 DIAGNOSIS — H35033 Hypertensive retinopathy, bilateral: Secondary | ICD-10-CM | POA: Diagnosis not present

## 2020-04-13 DIAGNOSIS — H5203 Hypermetropia, bilateral: Secondary | ICD-10-CM | POA: Diagnosis not present

## 2020-04-13 DIAGNOSIS — H52223 Regular astigmatism, bilateral: Secondary | ICD-10-CM | POA: Diagnosis not present

## 2020-04-13 DIAGNOSIS — I10 Essential (primary) hypertension: Secondary | ICD-10-CM | POA: Diagnosis not present

## 2020-04-13 DIAGNOSIS — E119 Type 2 diabetes mellitus without complications: Secondary | ICD-10-CM | POA: Diagnosis not present

## 2020-04-13 DIAGNOSIS — H524 Presbyopia: Secondary | ICD-10-CM | POA: Diagnosis not present

## 2020-04-15 DIAGNOSIS — R69 Illness, unspecified: Secondary | ICD-10-CM | POA: Diagnosis not present

## 2020-05-04 DIAGNOSIS — E119 Type 2 diabetes mellitus without complications: Secondary | ICD-10-CM | POA: Diagnosis not present

## 2020-05-04 DIAGNOSIS — E559 Vitamin D deficiency, unspecified: Secondary | ICD-10-CM | POA: Diagnosis not present

## 2020-05-04 DIAGNOSIS — E041 Nontoxic single thyroid nodule: Secondary | ICD-10-CM | POA: Diagnosis not present

## 2020-05-04 DIAGNOSIS — E039 Hypothyroidism, unspecified: Secondary | ICD-10-CM | POA: Diagnosis not present

## 2020-06-05 DIAGNOSIS — Z01419 Encounter for gynecological examination (general) (routine) without abnormal findings: Secondary | ICD-10-CM | POA: Diagnosis not present

## 2020-06-06 DIAGNOSIS — R5383 Other fatigue: Secondary | ICD-10-CM | POA: Diagnosis not present

## 2020-06-06 DIAGNOSIS — E559 Vitamin D deficiency, unspecified: Secondary | ICD-10-CM | POA: Diagnosis not present

## 2020-06-06 DIAGNOSIS — R0609 Other forms of dyspnea: Secondary | ICD-10-CM | POA: Diagnosis not present

## 2020-06-06 DIAGNOSIS — E538 Deficiency of other specified B group vitamins: Secondary | ICD-10-CM | POA: Diagnosis not present

## 2020-06-06 DIAGNOSIS — R400 Somnolence: Secondary | ICD-10-CM | POA: Diagnosis not present

## 2020-06-06 DIAGNOSIS — R1013 Epigastric pain: Secondary | ICD-10-CM | POA: Diagnosis not present

## 2020-06-08 DIAGNOSIS — E538 Deficiency of other specified B group vitamins: Secondary | ICD-10-CM | POA: Diagnosis not present

## 2020-07-05 DIAGNOSIS — E538 Deficiency of other specified B group vitamins: Secondary | ICD-10-CM | POA: Diagnosis not present

## 2020-07-07 DIAGNOSIS — H52223 Regular astigmatism, bilateral: Secondary | ICD-10-CM | POA: Diagnosis not present

## 2020-07-07 DIAGNOSIS — H524 Presbyopia: Secondary | ICD-10-CM | POA: Diagnosis not present

## 2020-07-07 DIAGNOSIS — H5203 Hypermetropia, bilateral: Secondary | ICD-10-CM | POA: Diagnosis not present

## 2020-07-07 DIAGNOSIS — H40011 Open angle with borderline findings, low risk, right eye: Secondary | ICD-10-CM | POA: Diagnosis not present

## 2020-07-07 DIAGNOSIS — H40052 Ocular hypertension, left eye: Secondary | ICD-10-CM | POA: Diagnosis not present

## 2020-07-07 DIAGNOSIS — H40012 Open angle with borderline findings, low risk, left eye: Secondary | ICD-10-CM | POA: Diagnosis not present

## 2020-07-07 DIAGNOSIS — H40053 Ocular hypertension, bilateral: Secondary | ICD-10-CM | POA: Diagnosis not present

## 2020-07-18 DIAGNOSIS — G4719 Other hypersomnia: Secondary | ICD-10-CM | POA: Diagnosis not present

## 2020-07-18 DIAGNOSIS — I10 Essential (primary) hypertension: Secondary | ICD-10-CM | POA: Diagnosis not present

## 2020-07-18 DIAGNOSIS — G4752 REM sleep behavior disorder: Secondary | ICD-10-CM | POA: Diagnosis not present

## 2020-07-18 DIAGNOSIS — E039 Hypothyroidism, unspecified: Secondary | ICD-10-CM | POA: Diagnosis not present

## 2020-07-18 DIAGNOSIS — E669 Obesity, unspecified: Secondary | ICD-10-CM | POA: Diagnosis not present

## 2020-07-28 DIAGNOSIS — E538 Deficiency of other specified B group vitamins: Secondary | ICD-10-CM | POA: Diagnosis not present

## 2020-07-31 ENCOUNTER — Telehealth: Payer: Self-pay | Admitting: Oncology

## 2020-07-31 NOTE — Telephone Encounter (Signed)
Rescheduled appointment per 03/06 schedule message. Contacted patient, patient is aware.

## 2020-08-01 DIAGNOSIS — E559 Vitamin D deficiency, unspecified: Secondary | ICD-10-CM | POA: Diagnosis not present

## 2020-08-01 DIAGNOSIS — E039 Hypothyroidism, unspecified: Secondary | ICD-10-CM | POA: Diagnosis not present

## 2020-08-02 ENCOUNTER — Other Ambulatory Visit: Payer: Medicare HMO

## 2020-08-02 ENCOUNTER — Ambulatory Visit: Payer: Medicare HMO | Admitting: Oncology

## 2020-08-03 DIAGNOSIS — E559 Vitamin D deficiency, unspecified: Secondary | ICD-10-CM | POA: Diagnosis not present

## 2020-08-03 DIAGNOSIS — E7800 Pure hypercholesterolemia, unspecified: Secondary | ICD-10-CM | POA: Diagnosis not present

## 2020-08-03 DIAGNOSIS — Z853 Personal history of malignant neoplasm of breast: Secondary | ICD-10-CM | POA: Diagnosis not present

## 2020-08-03 DIAGNOSIS — E039 Hypothyroidism, unspecified: Secondary | ICD-10-CM | POA: Diagnosis not present

## 2020-08-03 DIAGNOSIS — E041 Nontoxic single thyroid nodule: Secondary | ICD-10-CM | POA: Diagnosis not present

## 2020-08-03 DIAGNOSIS — E119 Type 2 diabetes mellitus without complications: Secondary | ICD-10-CM | POA: Diagnosis not present

## 2020-08-03 DIAGNOSIS — I10 Essential (primary) hypertension: Secondary | ICD-10-CM | POA: Diagnosis not present

## 2020-08-04 ENCOUNTER — Other Ambulatory Visit (HOSPITAL_BASED_OUTPATIENT_CLINIC_OR_DEPARTMENT_OTHER): Payer: Self-pay

## 2020-08-04 DIAGNOSIS — G4710 Hypersomnia, unspecified: Secondary | ICD-10-CM

## 2020-08-04 DIAGNOSIS — R0683 Snoring: Secondary | ICD-10-CM

## 2020-08-04 DIAGNOSIS — R0681 Apnea, not elsewhere classified: Secondary | ICD-10-CM

## 2020-08-16 DIAGNOSIS — E538 Deficiency of other specified B group vitamins: Secondary | ICD-10-CM | POA: Diagnosis not present

## 2020-09-06 DIAGNOSIS — E538 Deficiency of other specified B group vitamins: Secondary | ICD-10-CM | POA: Diagnosis not present

## 2020-09-11 DIAGNOSIS — Z0000 Encounter for general adult medical examination without abnormal findings: Secondary | ICD-10-CM | POA: Diagnosis not present

## 2020-09-11 DIAGNOSIS — R69 Illness, unspecified: Secondary | ICD-10-CM | POA: Diagnosis not present

## 2020-09-11 DIAGNOSIS — E559 Vitamin D deficiency, unspecified: Secondary | ICD-10-CM | POA: Diagnosis not present

## 2020-09-11 DIAGNOSIS — E538 Deficiency of other specified B group vitamins: Secondary | ICD-10-CM | POA: Diagnosis not present

## 2020-09-11 DIAGNOSIS — E7800 Pure hypercholesterolemia, unspecified: Secondary | ICD-10-CM | POA: Diagnosis not present

## 2020-09-11 DIAGNOSIS — M109 Gout, unspecified: Secondary | ICD-10-CM | POA: Diagnosis not present

## 2020-09-11 DIAGNOSIS — I10 Essential (primary) hypertension: Secondary | ICD-10-CM | POA: Diagnosis not present

## 2020-09-11 DIAGNOSIS — E039 Hypothyroidism, unspecified: Secondary | ICD-10-CM | POA: Diagnosis not present

## 2020-09-11 DIAGNOSIS — E611 Iron deficiency: Secondary | ICD-10-CM | POA: Diagnosis not present

## 2020-09-28 DIAGNOSIS — I10 Essential (primary) hypertension: Secondary | ICD-10-CM | POA: Diagnosis not present

## 2020-09-28 DIAGNOSIS — M2569 Stiffness of other specified joint, not elsewhere classified: Secondary | ICD-10-CM | POA: Diagnosis not present

## 2020-09-28 DIAGNOSIS — M542 Cervicalgia: Secondary | ICD-10-CM | POA: Diagnosis not present

## 2020-09-28 DIAGNOSIS — R531 Weakness: Secondary | ICD-10-CM | POA: Diagnosis not present

## 2020-10-02 DIAGNOSIS — M2569 Stiffness of other specified joint, not elsewhere classified: Secondary | ICD-10-CM | POA: Diagnosis not present

## 2020-10-02 DIAGNOSIS — M542 Cervicalgia: Secondary | ICD-10-CM | POA: Diagnosis not present

## 2020-10-02 DIAGNOSIS — R531 Weakness: Secondary | ICD-10-CM | POA: Diagnosis not present

## 2020-10-02 DIAGNOSIS — I10 Essential (primary) hypertension: Secondary | ICD-10-CM | POA: Diagnosis not present

## 2020-10-05 ENCOUNTER — Ambulatory Visit (HOSPITAL_BASED_OUTPATIENT_CLINIC_OR_DEPARTMENT_OTHER): Payer: Medicare HMO | Attending: Internal Medicine | Admitting: Internal Medicine

## 2020-10-05 ENCOUNTER — Other Ambulatory Visit: Payer: Self-pay

## 2020-10-05 DIAGNOSIS — R0683 Snoring: Secondary | ICD-10-CM | POA: Diagnosis not present

## 2020-10-05 DIAGNOSIS — R400 Somnolence: Secondary | ICD-10-CM | POA: Insufficient documentation

## 2020-10-05 DIAGNOSIS — G4752 REM sleep behavior disorder: Secondary | ICD-10-CM | POA: Insufficient documentation

## 2020-10-05 DIAGNOSIS — G479 Sleep disorder, unspecified: Secondary | ICD-10-CM | POA: Diagnosis not present

## 2020-10-05 DIAGNOSIS — G4710 Hypersomnia, unspecified: Secondary | ICD-10-CM | POA: Insufficient documentation

## 2020-10-05 DIAGNOSIS — R0681 Apnea, not elsewhere classified: Secondary | ICD-10-CM

## 2020-10-06 DIAGNOSIS — I10 Essential (primary) hypertension: Secondary | ICD-10-CM | POA: Diagnosis not present

## 2020-10-06 DIAGNOSIS — M542 Cervicalgia: Secondary | ICD-10-CM | POA: Diagnosis not present

## 2020-10-06 DIAGNOSIS — R531 Weakness: Secondary | ICD-10-CM | POA: Diagnosis not present

## 2020-10-06 DIAGNOSIS — M2569 Stiffness of other specified joint, not elsewhere classified: Secondary | ICD-10-CM | POA: Diagnosis not present

## 2020-10-08 DIAGNOSIS — G4733 Obstructive sleep apnea (adult) (pediatric): Secondary | ICD-10-CM | POA: Diagnosis not present

## 2020-10-10 DIAGNOSIS — G4733 Obstructive sleep apnea (adult) (pediatric): Secondary | ICD-10-CM | POA: Diagnosis not present

## 2020-10-10 DIAGNOSIS — I10 Essential (primary) hypertension: Secondary | ICD-10-CM | POA: Diagnosis not present

## 2020-10-11 NOTE — Procedures (Signed)
NAME: Hannah Nicholson DATE OF BIRTH:  1950-06-28 MEDICAL RECORD NUMBER [id number]  LOCATION: [facility]  PHYSICIAN: Marius Ditch  DATE OF STUDY: 10/05/2020  SLEEP STUDY TYPE: Nocturnal Polysomnogram               REFERRING PHYSICIAN: Marius Ditch, MD  INDICATION FOR STUDY: see clinical information below  EPWORTH SLEEPINESS SCORE:  3 HEIGHT: 5\' 3"  (160 cm)  WEIGHT: 190 lb (86.2 kg)    Body mass index is 33.66 kg/m.  NECK SIZE: 15.5 in.  CLINICAL INFORMATION The patient was referred to the sleep center for evaluation of snoring, mild excessive daytime sleepiness, non-restorative sleep, night sweats, dry mouth. She has had occasional dream enactment.   MEDICATIONS The patient is on an SSRI. Patient self administered medications include: SYNTHROID. No sleep medicine administered during study.  SLEEP STUDY TECHNIQUE A multi-channel overnight Polysomnography study was performed. The channels recorded and monitored were central and occipital EEG, electrooculogram (EOG), submentalis EMG (chin), nasal and oral airflow, thoracic and abdominal wall motion, anterior tibialis EMG, snore microphone, electrocardiogram, and a pulse oximetry.  TECHNICAL COMMENTS Comments added by Technician: Patient had difficulty initiating sleep. Comments added by Scorer: N/A  SLEEP ARCHITECTURE The study was initiated at 10:45:18 PM and terminated at 5:09:48 AM. The total recorded time was 384.5 minutes. EEG confirmed total sleep time was 290 minutes yielding a sleep efficiency of 75.4%%. Sleep onset after lights out was 30.9 minutes with a REM latency of 205.5 minutes. The patient spent 21.9%% of the night in stage N1 sleep, 63.3%% in stage N2 sleep, 0.0%% in stage N3 and 14.8% in REM. Wake after sleep onset (WASO) was 63.6 minutes. The Arousal Index was 29.4/hour.  RESPIRATORY PARAMETERS There were a total of 10 respiratory disturbances out of which 0 were apneas ( 0  obstructive, 0 mixed, 0 central) and 10 hypopneas. The apnea/hypopnea index (AHI) was 2.1 events/hour. The central sleep apnea index was 0 events/hour. The REM AHI was 12.6 events/hour and NREM AHI was 0.2 events/hour. The supine AHI was 0.0 events/hour and the non supine AHI was 3 events/hour. The patient was supine during 30.36% of sleep. Respiratory disturbance index was 13.2/hour overall and 29.3/hour in REM sleep. Respiratory disturbances were associated with oxygen desaturation down to a nadir of 89.0% during sleep. The mean oxygen saturation during the study was 94.6%. The cumulative time under 88% oxygen saturation was 0 minutes.  LEG MOVEMENT DATA The total leg movements were 8 with a resulting leg movement index of 1.7/hr . Associated arousal with leg movement index was 0.2/hr.  CARDIAC DATA The underlying cardiac rhythm was most consistent with sinus rhythm. Mean heart rate during sleep was 58.4 bpm. Additional rhythm abnormalities include PVCs.  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 - No hypoxemia - Very few significant periodic leg movements(PLMs) during sleep. No significant associated arousals. - Normal REM Atonia in the one REM period seen in this study. This study is not consistent with REM sleep behavior disorder.   DIAGNOSIS - Obstructive sleep apnea by RDI, but not by AHI.   RECOMMENDATIONS - This mild degree of sleep fragmentation (seen in the RDI) could cause some daytime sleepiness. Treatment options could be explored with the patient.   Marius Ditch Sleep specialist, American Board of Internal Medicine  ELECTRONICALLY SIGNED ON:  10/11/2020, 6:38 AM [facility] PH: [phone]   FX: [phone] [place]

## 2020-11-07 DIAGNOSIS — E039 Hypothyroidism, unspecified: Secondary | ICD-10-CM | POA: Diagnosis not present

## 2020-11-07 DIAGNOSIS — E559 Vitamin D deficiency, unspecified: Secondary | ICD-10-CM | POA: Diagnosis not present

## 2020-11-07 NOTE — Progress Notes (Signed)
[facility]  [phone] [phone]     ID: Hannah Nicholson DOB: 01/01/1951  MR#: [id number]  FBP#:[id number]  Patient Care Team: Lawerance Cruel, MD as PCP - General (Family Medicine) Wilkes Potvin, Virgie Dad, MD as Consulting Physician (Oncology) Jacelyn Pi, MD as Referring Physician (Endocrinology) Clarene Essex, MD as Consulting Physician (Gastroenterology) Chauncey Cruel, MD OTHER MD:  CHIEF COMPLAINT: hx of left breast cancer, s/p mastectomy  CURRENT TREATMENT: Observation   INTERVAL HISTORY: Hannah Nicholson returns today for follow up of her history of breast cancer. She continues under observation alone.    Her most recent mammogram was completed on 02/11/2020 and showed no evidence of malignancy and breast density category C.    She also underwent a bone density test at that time that was normal (T-score of +0.2).   REVIEW OF SYSTEMS: Hannah Nicholson is doing "well".  She has some life stresses because of her middle son and we did discuss that at length today.  She is going to the gym about twice a week.  She is sleeping well.  A detailed review of systems today was otherwise noncontributory  COVID 19 VACCINATION STATUS: fully vaccinated [employer]), with booster 02/2020    HISTORY OF CURRENT ILLNESS: From Dr. Herbie Baltimore Murray's note 11/27/2011  "Hannah Nicholson is a 70 y.o. female who is seen today at the [facility] 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 At the time of a screening mammogram on 11/04/2011 she was noted to have calcifications in the left breast for which a diagnostic mammogram was obtained on 11/15/2011 showing worrisome calcifications in the lateral posterior left breast suspicious for DCIS. Stereotactic biopsy on 11/15/2011 was diagnostic for high-grade DCIS with necrosis with a microscopic focus of invasive ductal carcinoma. The focus was less than 0.1 cm. The prognostic profile  showed your to be 5%, PR to be 0 with a proliferation marker of 22%. HER-2/neu was amplified at 4.75. I'm not sure if this was based on the DCIS and/or microscopic focus of invasive ductal carcinoma. Breast MR on 11/26/2011 showed a 1.0 x 1.0 x 0.9 cm area of low-grade enhancement surrounding the biopsy marker deep in the upper outer quadrant of the left breast. There was also a vertically oriented linear biopsy tract, more posteriorly medially measuring 3.1 x 1.2 x 1.1 cm."  From Dr. Allayne Gitelman office visit at [facility] 04/05/2015:  "History of LEFT breast T28mc, NO, ER 5%, N0, HER 2 neu amplified by FMeriwether(per OSH office note) in 2013 treated [place] [place] [place] and radiation x 6 weeks. No adjuvant endocrine therapy. Pathology: Sentinel nodes 2,3,4 negative and LEFT axillary lymph node dissection no lymphoid tissue identified. LEFT breast lumpectomy no invasive carcinoma or microinvasive identified. Residual DCIS, grade 3, 1 mm size, all margins uninvolved. It is 1 mm from the anterior margin. Focal colonization of lobules with DCIS is present. Classic LCIS seen in specimen  The patient initially underwent screening mammogram on 01/19/15 at OSH, showing cluster of calcification at 100. Diagnostic imaging was performed on 01/19/15. Findings confirmed atypical lobular hyperplasia . [facility] [place] [facility] [place] [facility] [place] with clip placed.  Pathology findings were the following:  Focal atypical lobular hyperplasia "  The patient's subsequent history is as detailed below.   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   Sleep disturbance    Stress reaction    Unspecified asthma(493.90)    Vaginal atrophy    Vitamin D deficiency     PAST SURGICAL HISTORY: Past Surgical History:  Procedure Laterality Date   BREAST LUMPECTOMY Left 2013   BREAST CANCER AND RADIATION, NO CHEMO   BREAST SURGERY Left 2017   chemo done in 2017, mastectomy   CHOLECYSTECTOMY     COLONOSCOPY     TUBULAR ADENOMA   CYSTOSCOPY W/ URETERAL STENT PLACEMENT Left 09/20/2019   Procedure: CYSTOSCOPY WITH RETROGRADE PYELOGRAM/URETERAL STENT PLACEMENT;  Surgeon: Robley Fries, MD;  Location: [facility];  Service: Urology;  Laterality: Left;   CYSTOSCOPY/URETEROSCOPY/HOLMIUM LASER/STENT PLACEMENT Left 10/19/2019   Procedure: CYSTOSCOPY LEFT URETEROSCOPY/HOLMIUM LASER/STENT EXHANGE;  Surgeon: Robley Fries, MD;  Location: [facility];  Service: Urology;  Laterality: Left;   HEMORRHOID SURGERY     LAPAROSCOPIC INGUINAL HERNIA WITH UMBILICAL HERNIA     MASTECTOMY Left 2016   sinus surgery     TONSILLECTOMY     TOTAL ABDOMINAL HYSTERECTOMY     partial   TUBAL LIGATION      FAMILY HISTORY: Family History  Problem Relation Age of Onset   Asthma Father    Heart disease Mother    Prostate cancer Brother 53   Prostate cancer Maternal Uncle        dx 53s   Lupus Sister    Breast cancer Sister 50   Stroke Sister 79   Prostate cancer Maternal Uncle        dx 4s   Lung cancer Cousin        thought to be due to sarcoidosis  The patient was diagnosed with breast cancer at age 42.  She was diagnosed with a goiter in her 41s, thyroid nodules at age 75, uterine fibroids for which she had a hysterectomy in her late 31s, and has  fibroadenoma's of the breast in the past.  She has seven sisters and one brother.  One sister died of lupus, a sister, Hannah Nicholson, was diagnosed with breast cancer at age 35 and died at 78, and another sister, Hannah Nicholson, died of a stroke at age 64.  Her brother was diagnosed with prostate cancer at age 78.  Her mother died at age 31 of old age.  She had three brothers and four sisters.  Two brothers had prostate cancer, and an aunt's daughter was diagnosed with lung cancer, thought to be due to sarcoidosis.  There is no other reported cancer on either side of the family.   Patient's maternal ancestors are of African American descent, and paternal ancestors are of African American descent. There is no reported Ashkenazi Jewish ancestry. There is no known consanguinity.  Genetics testing at [facility] [place] found the patient to be BRCA1 and BRCA2 negative.  GYNECOLOGIC HISTORY:  No LMP recorded. Patient has had a hysterectomy. Menarche: 70 years old Age at first live birth: 70 years old [place] P 4 LMP with hysterectomy in her mid 99s HRT no  Hysterectomy? Yes, 1995 BSO?  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  Her husband Ed worked as Engineering geologist, now works part-time for a [employer].  Their oldest son Hannah Nicholson died from liver problems.  Son Hannah Nicholson lives in [place] where he works as a Chief Strategy Officer used to own his own gym in [employer] but now is disabled.  The youngest son Hannah Nicholson, 33 years old, currently lives at home with the patient.    ADVANCED DIRECTIVES: In the absence of any documentation to the contrary, the patient's spouse is their HCPOA.    HEALTH MAINTENANCE: Social History   Tobacco Use   Smoking status: Never   Smokeless tobacco: Never  Vaping Use   Vaping Use: Never used  Substance Use Topics   Alcohol use: No   Drug use: No     Colonoscopy: 04/2017, repeat 2023  PAP:  2012  Bone density: 01/2020, +0.2   Allergies  Allergen Reactions   Iodine Anaphylaxis and Other (See Comments)    Passes out, IV dye   Ivp Dye [Iodinated Diagnostic Agents] Anaphylaxis and Other (See Comments)    Passes out   Amlodipine Besylate     MAKES BLOOD SUGAR GO UP   Hydrochlorothiazide     GOUT   Lisinopril Cough    Current Outpatient Medications  Medication Sig Dispense Refill   calcium carbonate (OS-CAL - DOSED IN MG OF ELEMENTAL CALCIUM) 1250 (500 Ca) MG tablet Take 1 tablet by mouth daily with breakfast. Not taking     cyanocobalamin (,VITAMIN B-12,) 1000 MCG/ML injection Inject 1,000 mcg into the muscle every 30 (thirty) days.     fexofenadine (ALLEGRA) 180 MG tablet Take 180 mg by mouth daily as needed for allergies.      glimepiride (AMARYL) 2 MG tablet Take 2 mg by mouth daily.     hydrochlorothiazide (HYDRODIURIL) 25 MG tablet Take 1 tablet by mouth daily as needed (fluid).      metFORMIN (GLUCOPHAGE-XR) 500 MG 24 hr tablet Take 1,000 mg by mouth 2 (two) times daily.     metoprolol succinate (TOPROL-XL) 25 MG 24 hr tablet Take 1 tablet by mouth daily.     ONETOUCH ULTRA test strip 1 each daily.     sertraline (ZOLOFT) 50 MG tablet Take 50 mg by mouth daily.     SYNTHROID 50 MCG tablet Take 50 mcg by mouth daily.     telmisartan (MICARDIS) 80 MG tablet Take 1 tablet by mouth daily. Not taking     vitamin B-12 (CYANOCOBALAMIN) 1000 MCG tablet Take 1,000 mcg by mouth daily.     Vitamin D, Ergocalciferol, (DRISDOL) 1.25 MG (50000 UNIT) CAPS capsule Take 50,000 Units by mouth once a week.     No current facility-administered medications for this visit.    OBJECTIVE: African-American woman who appears stated age 70:   11/08/20 1345  BP: 135/70  Pulse: 81  Resp: 18  Temp: (!) 97.4 F (36.3 C)  SpO2: 99%     Body mass index is 33.92 kg/m.   Wt Readings from Last 3 Encounters:  11/08/20 191 lb 8 oz (86.9 kg)  10/05/20 190 lb (86.2 kg)  04/05/20 189 lb  14.4 oz (86.1 kg)   ECOG FS:1 - Symptomatic but completely ambulatory  Sclerae unicteric, EOMs intact Wearing a mask No cervical or supraclavicular adenopathy Lungs no rales or rhonchi Heart regular rate and rhythm Abd soft, nontender, positive bowel sounds MSK no focal spinal tenderness, no upper extremity lymphedema Neuro: nonfocal, well oriented, appropriate affect Breasts: Right breast is benign.  The left breast is status post mastectomy.  There is no evidence of local recurrence.  Both axillae are benign.   LAB RESULTS:  CMP     Component Value Date/Time   NA 140 10/26/2019 1539   K 4.0 10/26/2019 1539   CL 106 10/26/2019 1539   CO2 22 10/26/2019 1539   GLUCOSE 152 (H) 10/26/2019 1539   BUN 20 10/26/2019 1539   CREATININE 0.96 10/26/2019 1539   CALCIUM 9.2 10/26/2019 1539   PROT 6.8 10/26/2019 1539   ALBUMIN 4.1 10/26/2019 1539   AST 23 10/26/2019 1539   ALT 33 10/26/2019 1539   ALKPHOS 94 10/26/2019 1539   BILITOT 0.3 10/26/2019 1539   GFRNONAA >60 10/26/2019 1539   GFRAA >60 10/26/2019 1539    No results found for: TOTALPROTELP, ALBUMINELP, A1GS, A2GS, BETS, BETA2SER, GAMS, MSPIKE, SPEI  Lab Results  Component Value Date   WBC 3.3 (L) 11/08/2020   NEUTROABS 1.6 (L) 11/08/2020   HGB 13.2 11/08/2020   HCT 39.0 11/08/2020   MCV 90.1 11/08/2020   PLT 217 11/08/2020    No results found for: LABCA2  No components found for: JQZESP233  No results for input(s): INR in the last 168 hours.  No results found for: LABCA2  No results found for: AQT622  No results found for: QJF354  No results found for: TGY563  No results found for: CA2729  No components found for: HGQUANT  No results found for: CEA1 / No results found for: CEA1   No results found for: AFPTUMOR  No results found for: CHROMOGRNA  No results found for: KPAFRELGTCHN, LAMBDASER, KAPLAMBRATIO (kappa/lambda light chains)  No results found for: HGBA, HGBA2QUANT, HGBFQUANT,  HGBSQUAN (Hemoglobinopathy evaluation)   No results found for: LDH  No results found for: IRON, TIBC, IRONPCTSAT (Iron and TIBC)  No results found for: FERRITIN  Urinalysis    Component Value Date/Time   COLORURINE YELLOW 09/20/2019 1846   APPEARANCEUR CLEAR 09/20/2019 1846   LABSPEC <1.005 (L) 09/20/2019 1846   PHURINE 6.0 09/20/2019 1846   GLUCOSEU NEGATIVE 09/20/2019 1846   HGBUR SMALL (A) 09/20/2019 1846   BILIRUBINUR NEGATIVE 09/20/2019 1846   KETONESUR NEGATIVE 09/20/2019 1846   PROTEINUR NEGATIVE 09/20/2019 1846   UROBILINOGEN 0.2 07/17/2013 1520   NITRITE POSITIVE (A) 09/20/2019 1846   LEUKOCYTESUR SMALL (A) 09/20/2019 1846     STUDIES: SLEEP STUDY DOCUMENTS  Result Date: 10/26/2020 Ordered by an unspecified provider.  SLEEP STUDY DOCUMENTS  Result Date: 10/26/2020 Ordered by an unspecified provider.  SLEEP STUDY DOCUMENTS  Result Date: 10/11/2020 Ordered by an unspecified provider.     ELIGIBLE FOR AVAILABLE RESEARCH PROTOCOL: no  ASSESSMENT: 70 y.o. [place] woman, status post left lumpectomy and sentinel lymph node sampling 01/23/2012 at [facility] [place] for a pT1(mic) pN0, stage IA invasive ductal breast cancer, with also DCIS present, estrogen receptor 5% positive, HER-2 amplified, with negative margins  (a) a total of 3 left axillary lymph nodes removed  (b) adjuvant radiation x6 weeks  (c) no antiestrogens  (1) status post neoadjuvant paclitaxel and trastuzumab x3 months,  then adjuvant trastuzumab for an additional 9 months   (a) left mastectomy at [facility] 01/26/2015 showed only residual atypical lobular hyperplasia (pT0 pN0)   (b) a single left axillary lymph node was removed  (2) genetics testing considered July 2013 but patient's risk felt not to be elevated enough for testing   (a) testing at [facility] reportedly negative (specifically BRCA1 and BRCA2 negative).   PLAN: Debar is now close to 6 years out from definitive  surgery for her breast cancer with no evidence of disease recurrence.  This is very favorable.  At this point I feel comfortable releasing her to her primary care physician.  I gave her a copy of her breast cancer records.  All she will need in terms of breast cancer follow-up is a yearly right mammogram and a yearly left chest wall physician exam  I wrote her a prescription for bras and prostheses.  I will be glad to see Nettey again at any point in the future if and when the need arises but as of now are making no further routine appointments for her here  Total encounter time 20 minutes.Sarajane Jews C. Marchell Froman, MD 11/08/20 2:08 PM Medical Oncology and Hematology [facility] [place], [place] [address] Tel. [phone]    Fax. [phone]   I, Wilburn Mylar, am acting as scribe for Dr. Virgie Dad. Jakaylah Schlafer.  I, Lurline Del MD, have reviewed the above documentation for accuracy and completeness, and I agree with the above.    *Total Encounter Time as defined by the Centers for Medicare and Medicaid Services includes, in addition to the face-to-face time of a patient visit (documented in the note above) non-face-to-face time: obtaining and reviewing outside history, ordering and reviewing medications, tests or procedures, care coordination (communications with other health care professionals or caregivers) and documentation in the medical record.

## 2020-11-08 ENCOUNTER — Inpatient Hospital Stay: Payer: Medicare HMO | Attending: Adult Health

## 2020-11-08 ENCOUNTER — Other Ambulatory Visit: Payer: Self-pay | Admitting: *Deleted

## 2020-11-08 ENCOUNTER — Inpatient Hospital Stay (HOSPITAL_BASED_OUTPATIENT_CLINIC_OR_DEPARTMENT_OTHER): Payer: Medicare HMO | Admitting: Oncology

## 2020-11-08 ENCOUNTER — Other Ambulatory Visit: Payer: Self-pay

## 2020-11-08 VITALS — BP 135/70 | HR 81 | Temp 97.4°F | Resp 18 | Ht 63.0 in | Wt 191.5 lb

## 2020-11-08 DIAGNOSIS — Z9012 Acquired absence of left breast and nipple: Secondary | ICD-10-CM | POA: Diagnosis not present

## 2020-11-08 DIAGNOSIS — C50412 Malignant neoplasm of upper-outer quadrant of left female breast: Secondary | ICD-10-CM | POA: Diagnosis not present

## 2020-11-08 DIAGNOSIS — Z853 Personal history of malignant neoplasm of breast: Secondary | ICD-10-CM | POA: Insufficient documentation

## 2020-11-08 DIAGNOSIS — Z923 Personal history of irradiation: Secondary | ICD-10-CM | POA: Insufficient documentation

## 2020-11-08 DIAGNOSIS — Z170 Estrogen receptor positive status [ER+]: Secondary | ICD-10-CM | POA: Diagnosis not present

## 2020-11-08 LAB — CBC WITH DIFFERENTIAL (CANCER CENTER ONLY)
Abs Immature Granulocytes: 0.01 10*3/uL (ref 0.00–0.07)
Basophils Absolute: 0.1 10*3/uL (ref 0.0–0.1)
Basophils Relative: 2 %
Eosinophils Absolute: 0.1 10*3/uL (ref 0.0–0.5)
Eosinophils Relative: 4 %
HCT: 39 % (ref 36.0–46.0)
Hemoglobin: 13.2 g/dL (ref 12.0–15.0)
Immature Granulocytes: 0 %
Lymphocytes Relative: 39 %
Lymphs Abs: 1.3 10*3/uL (ref 0.7–4.0)
MCH: 30.5 pg (ref 26.0–34.0)
MCHC: 33.8 g/dL (ref 30.0–36.0)
MCV: 90.1 fL (ref 80.0–100.0)
Monocytes Absolute: 0.3 10*3/uL (ref 0.1–1.0)
Monocytes Relative: 8 %
Neutro Abs: 1.6 10*3/uL — ABNORMAL LOW (ref 1.7–7.7)
Neutrophils Relative %: 47 %
Platelet Count: 217 10*3/uL (ref 150–400)
RBC: 4.33 MIL/uL (ref 3.87–5.11)
RDW: 12.1 % (ref 11.5–15.5)
WBC Count: 3.3 10*3/uL — ABNORMAL LOW (ref 4.0–10.5)
nRBC: 0 % (ref 0.0–0.2)

## 2020-11-08 LAB — CMP (CANCER CENTER ONLY)
ALT: 16 U/L (ref 0–44)
AST: 14 U/L — ABNORMAL LOW (ref 15–41)
Albumin: 4.2 g/dL (ref 3.5–5.0)
Alkaline Phosphatase: 74 U/L (ref 38–126)
Anion gap: 11 (ref 5–15)
BUN: 16 mg/dL (ref 8–23)
CO2: 23 mmol/L (ref 22–32)
Calcium: 9.6 mg/dL (ref 8.9–10.3)
Chloride: 105 mmol/L (ref 98–111)
Creatinine: 1.45 mg/dL — ABNORMAL HIGH (ref 0.44–1.00)
GFR, Estimated: 39 mL/min — ABNORMAL LOW (ref >60)
Glucose, Bld: 211 mg/dL — ABNORMAL HIGH (ref 70–99)
Potassium: 4.1 mmol/L (ref 3.5–5.1)
Sodium: 139 mmol/L (ref 135–145)
Total Bilirubin: 0.5 mg/dL (ref 0.3–1.2)
Total Protein: 6.9 g/dL (ref 6.5–8.1)

## 2020-11-09 DIAGNOSIS — E119 Type 2 diabetes mellitus without complications: Secondary | ICD-10-CM | POA: Diagnosis not present

## 2020-11-09 DIAGNOSIS — E039 Hypothyroidism, unspecified: Secondary | ICD-10-CM | POA: Diagnosis not present

## 2020-11-09 DIAGNOSIS — E7800 Pure hypercholesterolemia, unspecified: Secondary | ICD-10-CM | POA: Diagnosis not present

## 2020-11-09 DIAGNOSIS — E041 Nontoxic single thyroid nodule: Secondary | ICD-10-CM | POA: Diagnosis not present

## 2020-11-09 DIAGNOSIS — I10 Essential (primary) hypertension: Secondary | ICD-10-CM | POA: Diagnosis not present

## 2020-11-09 DIAGNOSIS — Z853 Personal history of malignant neoplasm of breast: Secondary | ICD-10-CM | POA: Diagnosis not present

## 2020-11-09 DIAGNOSIS — E559 Vitamin D deficiency, unspecified: Secondary | ICD-10-CM | POA: Diagnosis not present

## 2020-11-15 ENCOUNTER — Other Ambulatory Visit: Payer: Self-pay | Admitting: Internal Medicine

## 2020-11-15 DIAGNOSIS — E041 Nontoxic single thyroid nodule: Secondary | ICD-10-CM

## 2020-11-20 DIAGNOSIS — L729 Follicular cyst of the skin and subcutaneous tissue, unspecified: Secondary | ICD-10-CM | POA: Diagnosis not present

## 2020-11-20 DIAGNOSIS — L603 Nail dystrophy: Secondary | ICD-10-CM | POA: Diagnosis not present

## 2020-11-20 DIAGNOSIS — L680 Hirsutism: Secondary | ICD-10-CM | POA: Diagnosis not present

## 2020-11-20 DIAGNOSIS — L708 Other acne: Secondary | ICD-10-CM | POA: Diagnosis not present

## 2020-11-20 DIAGNOSIS — M67449 Ganglion, unspecified hand: Secondary | ICD-10-CM | POA: Diagnosis not present

## 2020-11-20 DIAGNOSIS — L731 Pseudofolliculitis barbae: Secondary | ICD-10-CM | POA: Diagnosis not present

## 2020-11-20 DIAGNOSIS — D1801 Hemangioma of skin and subcutaneous tissue: Secondary | ICD-10-CM | POA: Diagnosis not present

## 2020-11-20 DIAGNOSIS — L821 Other seborrheic keratosis: Secondary | ICD-10-CM | POA: Diagnosis not present

## 2020-11-20 DIAGNOSIS — D239 Other benign neoplasm of skin, unspecified: Secondary | ICD-10-CM | POA: Diagnosis not present

## 2020-11-23 ENCOUNTER — Ambulatory Visit: Payer: Medicare HMO | Admitting: Internal Medicine

## 2020-11-30 ENCOUNTER — Ambulatory Visit
Admission: RE | Admit: 2020-11-30 | Discharge: 2020-11-30 | Disposition: A | Discharge status: Home / Self Care | Payer: Medicare HMO | Source: Ambulatory Visit | Attending: Internal Medicine | Admitting: Internal Medicine

## 2020-11-30 DIAGNOSIS — E041 Nontoxic single thyroid nodule: Secondary | ICD-10-CM

## 2020-12-06 DIAGNOSIS — E538 Deficiency of other specified B group vitamins: Secondary | ICD-10-CM | POA: Diagnosis not present

## 2020-12-28 DIAGNOSIS — J069 Acute upper respiratory infection, unspecified: Secondary | ICD-10-CM | POA: Diagnosis not present

## 2020-12-28 DIAGNOSIS — R509 Fever, unspecified: Secondary | ICD-10-CM | POA: Diagnosis not present

## 2020-12-28 DIAGNOSIS — U071 COVID-19: Secondary | ICD-10-CM | POA: Diagnosis not present

## 2020-12-28 DIAGNOSIS — Z20822 Contact with and (suspected) exposure to covid-19: Secondary | ICD-10-CM | POA: Diagnosis not present

## 2020-12-28 DIAGNOSIS — R0981 Nasal congestion: Secondary | ICD-10-CM | POA: Diagnosis not present

## 2020-12-28 DIAGNOSIS — R051 Acute cough: Secondary | ICD-10-CM | POA: Diagnosis not present

## 2021-01-24 ENCOUNTER — Other Ambulatory Visit: Payer: Self-pay | Admitting: Obstetrics and Gynecology

## 2021-01-24 DIAGNOSIS — Z1231 Encounter for screening mammogram for malignant neoplasm of breast: Secondary | ICD-10-CM

## 2021-02-12 ENCOUNTER — Telehealth: Payer: Medicare HMO | Admitting: Internal Medicine

## 2021-02-13 DIAGNOSIS — E041 Nontoxic single thyroid nodule: Secondary | ICD-10-CM | POA: Diagnosis not present

## 2021-02-13 DIAGNOSIS — I10 Essential (primary) hypertension: Secondary | ICD-10-CM | POA: Diagnosis not present

## 2021-02-13 DIAGNOSIS — E559 Vitamin D deficiency, unspecified: Secondary | ICD-10-CM | POA: Diagnosis not present

## 2021-02-13 DIAGNOSIS — E119 Type 2 diabetes mellitus without complications: Secondary | ICD-10-CM | POA: Diagnosis not present

## 2021-02-13 DIAGNOSIS — Z853 Personal history of malignant neoplasm of breast: Secondary | ICD-10-CM | POA: Diagnosis not present

## 2021-02-13 DIAGNOSIS — E7800 Pure hypercholesterolemia, unspecified: Secondary | ICD-10-CM | POA: Diagnosis not present

## 2021-02-13 DIAGNOSIS — E039 Hypothyroidism, unspecified: Secondary | ICD-10-CM | POA: Diagnosis not present

## 2021-02-14 DIAGNOSIS — D485 Neoplasm of uncertain behavior of skin: Secondary | ICD-10-CM | POA: Diagnosis not present

## 2021-02-14 DIAGNOSIS — Z23 Encounter for immunization: Secondary | ICD-10-CM | POA: Diagnosis not present

## 2021-02-14 DIAGNOSIS — R030 Elevated blood-pressure reading, without diagnosis of hypertension: Secondary | ICD-10-CM | POA: Diagnosis not present

## 2021-02-14 DIAGNOSIS — N859 Noninflammatory disorder of uterus, unspecified: Secondary | ICD-10-CM | POA: Diagnosis not present

## 2021-02-15 ENCOUNTER — Other Ambulatory Visit: Payer: Self-pay | Admitting: Internal Medicine

## 2021-02-15 DIAGNOSIS — E041 Nontoxic single thyroid nodule: Secondary | ICD-10-CM

## 2021-03-06 ENCOUNTER — Other Ambulatory Visit: Payer: Self-pay

## 2021-03-06 ENCOUNTER — Ambulatory Visit
Admission: RE | Admit: 2021-03-06 | Discharge: 2021-03-06 | Disposition: A | Discharge status: Home / Self Care | Payer: Medicare HMO | Source: Ambulatory Visit | Attending: Obstetrics and Gynecology | Admitting: Obstetrics and Gynecology

## 2021-03-06 DIAGNOSIS — Z1231 Encounter for screening mammogram for malignant neoplasm of breast: Secondary | ICD-10-CM | POA: Diagnosis not present

## 2021-03-15 ENCOUNTER — Other Ambulatory Visit: Payer: Self-pay

## 2021-03-15 ENCOUNTER — Ambulatory Visit
Admission: RE | Admit: 2021-03-15 | Discharge: 2021-03-15 | Disposition: A | Discharge status: Home / Self Care | Payer: Medicare HMO | Source: Ambulatory Visit | Attending: Internal Medicine | Admitting: Internal Medicine

## 2021-03-15 ENCOUNTER — Other Ambulatory Visit (HOSPITAL_COMMUNITY)
Admission: RE | Admit: 2021-03-15 | Discharge: 2021-03-15 | Disposition: A | Discharge status: Home / Self Care | Payer: Medicare HMO | Source: Ambulatory Visit | Attending: Internal Medicine | Admitting: Internal Medicine

## 2021-03-15 DIAGNOSIS — E041 Nontoxic single thyroid nodule: Secondary | ICD-10-CM | POA: Insufficient documentation

## 2021-03-16 LAB — CYTOLOGY - NON PAP

## 2021-03-22 ENCOUNTER — Other Ambulatory Visit: Payer: Self-pay | Admitting: Obstetrics and Gynecology

## 2021-03-22 DIAGNOSIS — E041 Nontoxic single thyroid nodule: Secondary | ICD-10-CM | POA: Diagnosis not present

## 2021-03-22 DIAGNOSIS — Z1231 Encounter for screening mammogram for malignant neoplasm of breast: Secondary | ICD-10-CM

## 2021-03-28 ENCOUNTER — Ambulatory Visit: Payer: Medicare HMO | Admitting: Podiatry

## 2021-03-28 ENCOUNTER — Other Ambulatory Visit: Payer: Self-pay

## 2021-03-28 DIAGNOSIS — E1169 Type 2 diabetes mellitus with other specified complication: Secondary | ICD-10-CM | POA: Diagnosis not present

## 2021-03-28 DIAGNOSIS — T451X5A Adverse effect of antineoplastic and immunosuppressive drugs, initial encounter: Secondary | ICD-10-CM | POA: Diagnosis not present

## 2021-03-28 DIAGNOSIS — M79675 Pain in left toe(s): Secondary | ICD-10-CM

## 2021-03-28 DIAGNOSIS — M545 Low back pain, unspecified: Secondary | ICD-10-CM | POA: Insufficient documentation

## 2021-03-28 DIAGNOSIS — M2141 Flat foot [pes planus] (acquired), right foot: Secondary | ICD-10-CM | POA: Diagnosis not present

## 2021-03-28 DIAGNOSIS — G620 Drug-induced polyneuropathy: Secondary | ICD-10-CM

## 2021-03-28 DIAGNOSIS — R5383 Other fatigue: Secondary | ICD-10-CM | POA: Insufficient documentation

## 2021-03-28 DIAGNOSIS — E559 Vitamin D deficiency, unspecified: Secondary | ICD-10-CM | POA: Insufficient documentation

## 2021-03-28 DIAGNOSIS — B351 Tinea unguium: Secondary | ICD-10-CM

## 2021-03-28 DIAGNOSIS — Z853 Personal history of malignant neoplasm of breast: Secondary | ICD-10-CM | POA: Insufficient documentation

## 2021-03-28 DIAGNOSIS — E669 Obesity, unspecified: Secondary | ICD-10-CM | POA: Insufficient documentation

## 2021-03-28 DIAGNOSIS — D0590 Unspecified type of carcinoma in situ of unspecified breast: Secondary | ICD-10-CM | POA: Insufficient documentation

## 2021-03-28 DIAGNOSIS — E538 Deficiency of other specified B group vitamins: Secondary | ICD-10-CM | POA: Insufficient documentation

## 2021-03-28 DIAGNOSIS — G4733 Obstructive sleep apnea (adult) (pediatric): Secondary | ICD-10-CM | POA: Insufficient documentation

## 2021-03-28 DIAGNOSIS — E7800 Pure hypercholesterolemia, unspecified: Secondary | ICD-10-CM | POA: Insufficient documentation

## 2021-03-28 DIAGNOSIS — E041 Nontoxic single thyroid nodule: Secondary | ICD-10-CM | POA: Insufficient documentation

## 2021-03-28 DIAGNOSIS — M722 Plantar fascial fibromatosis: Secondary | ICD-10-CM

## 2021-03-28 DIAGNOSIS — H35039 Hypertensive retinopathy, unspecified eye: Secondary | ICD-10-CM | POA: Insufficient documentation

## 2021-03-28 DIAGNOSIS — R400 Somnolence: Secondary | ICD-10-CM | POA: Insufficient documentation

## 2021-03-28 DIAGNOSIS — M79674 Pain in right toe(s): Secondary | ICD-10-CM | POA: Diagnosis not present

## 2021-03-28 DIAGNOSIS — E119 Type 2 diabetes mellitus without complications: Secondary | ICD-10-CM

## 2021-03-28 DIAGNOSIS — G479 Sleep disorder, unspecified: Secondary | ICD-10-CM | POA: Insufficient documentation

## 2021-03-28 DIAGNOSIS — Z860100 Personal history of colon polyps, unspecified: Secondary | ICD-10-CM | POA: Insufficient documentation

## 2021-03-28 DIAGNOSIS — F430 Acute stress reaction: Secondary | ICD-10-CM | POA: Insufficient documentation

## 2021-03-28 DIAGNOSIS — M2142 Flat foot [pes planus] (acquired), left foot: Secondary | ICD-10-CM | POA: Diagnosis not present

## 2021-03-28 DIAGNOSIS — E039 Hypothyroidism, unspecified: Secondary | ICD-10-CM | POA: Insufficient documentation

## 2021-03-28 DIAGNOSIS — M109 Gout, unspecified: Secondary | ICD-10-CM | POA: Insufficient documentation

## 2021-03-28 DIAGNOSIS — N952 Postmenopausal atrophic vaginitis: Secondary | ICD-10-CM | POA: Insufficient documentation

## 2021-03-28 DIAGNOSIS — F526 Dyspareunia not due to a substance or known physiological condition: Secondary | ICD-10-CM | POA: Insufficient documentation

## 2021-03-28 DIAGNOSIS — I10 Essential (primary) hypertension: Secondary | ICD-10-CM | POA: Insufficient documentation

## 2021-03-28 NOTE — Patient Instructions (Signed)
Diabetes Mellitus and Foot Care Foot care is an important part of your health, especially when you have diabetes. Diabetes may cause you to have problems because of poor blood flow (circulation) to your feet and legs, which can cause your skin to: Become thinner and drier. Break more easily. Heal more slowly. Peel and crack. You may also have nerve damage (neuropathy) in your legs and feet, causing decreased feeling in them. This means that you may not notice minor injuries to your feet that could lead to more serious problems. Noticing and addressing any potential problems early is the best way to prevent future foot problems. How to care for your feet Foot hygiene  Wash your feet daily with warm water and mild soap. Do not use hot water. Then, pat your feet and the areas between your toes until they are completely dry. Do not soak your feet as this can dry your skin. Trim your toenails straight across. Do not dig under them or around the cuticle. File the edges of your nails with an emery board or nail file. Apply a moisturizing lotion or petroleum jelly to the skin on your feet and to dry, brittle toenails. Use lotion that does not contain alcohol and is unscented. Do not apply lotion between your toes. Shoes and socks Wear clean socks or stockings every day. Make sure they are not too tight. Do not wear knee-high stockings since they may decrease blood flow to your legs. Wear shoes that fit properly and have enough cushioning. Always look in your shoes before you put them on to be sure there are no objects inside. To break in new shoes, wear them for just a few hours a day. This prevents injuries on your feet. Wounds, scrapes, corns, and calluses  Check your feet daily for blisters, cuts, bruises, sores, and redness. If you cannot see the bottom of your feet, use a mirror or ask someone for help. Do not cut corns or calluses or try to remove them with medicine. If you find a minor scrape,  cut, or break in the skin on your feet, keep it and the skin around it clean and dry. You may clean these areas with mild soap and water. Do not clean the area with peroxide, alcohol, or iodine. If you have a wound, scrape, corn, or callus on your foot, look at it several times a day to make sure it is healing and not infected. Check for: Redness, swelling, or pain. Fluid or blood. Warmth. Pus or a bad smell. General tips Do not cross your legs. This may decrease blood flow to your feet. Do not use heating pads or hot water bottles on your feet. They may burn your skin. If you have lost feeling in your feet or legs, you may not know this is happening until it is too late. Protect your feet from hot and cold by wearing shoes, such as at the beach or on hot pavement. Schedule a complete foot exam at least once a year (annually) or more often if you have foot problems. Report any cuts, sores, or bruises to your health care provider immediately. Where to find more information American Diabetes Association: www.diabetes.org Association of Diabetes Care & Education Specialists: www.diabeteseducator.org Contact a health care provider if: You have a medical condition that increases your risk of infection and you have any cuts, sores, or bruises on your feet. You have an injury that is not healing. You have redness on your legs or feet. You   feel burning or tingling in your legs or feet. You have pain or cramps in your legs and feet. Your legs or feet are numb. Your feet always feel cold. You have pain around any toenails. Get help right away if: You have a wound, scrape, corn, or callus on your foot and: You have pain, swelling, or redness that gets worse. You have fluid or blood coming from the wound, scrape, corn, or callus. Your wound, scrape, corn, or callus feels warm to the touch. You have pus or a bad smell coming from the wound, scrape, corn, or callus. You have a fever. You have a red  line going up your leg. Summary Check your feet every day for blisters, cuts, bruises, sores, and redness. Apply a moisturizing lotion or petroleum jelly to the skin on your feet and to dry, brittle toenails. Wear shoes that fit properly and have enough cushioning. If you have foot problems, report any cuts, sores, or bruises to your health care provider immediately. Schedule a complete foot exam at least once a year (annually) or more often if you have foot problems. This information is not intended to replace advice given to you by your health care provider. Make sure you discuss any questions you have with your health care provider. Document Revised: 12/02/2019 Document Reviewed: 12/02/2019 Elsevier Patient Education  [place]? An infection that lies within the keratin of your nail plate that is caused by a fungus.  WHY ME? Fungal infections affect all ages, sexes, races, and creeds.  There may be many factors that predispose you to a fungal infection such as age, coexisting medical conditions such as diabetes, or an autoimmune disease; stress, medications, fatigue, genetics, etc.  Bottom line: fungus thrives in a warm, moist environment and your shoes offer such a location.  IS IT CONTAGIOUS? Theoretically, yes.  You do not want to share shoes, nail clippers or files with someone who has fungal toenails.  Walking around barefoot in the same room or sleeping in the same bed is unlikely to transfer the organism.  It is important to realize, however, that fungus can spread easily from one nail to the next on the same foot.  HOW DO WE TREAT THIS?  There are several ways to treat this condition.  Treatment may depend on many factors such as age, medications, pregnancy, liver and kidney conditions, etc.  It is best to ask your doctor which options are available to you.  No treatment.   Unlike many other medical concerns, you can live with this  condition.  However for many people this can be a painful condition and may lead to ingrown toenails or a bacterial infection.  It is recommended that you keep the nails cut short to help reduce the amount of fungal nail. Topical treatment.  These range from herbal remedies to prescription strength nail lacquers.  About 40-50% effective, topicals require twice daily application for approximately 9 to 12 months or until an entirely new nail has grown out.  The most effective topicals are medical grade medications available through physicians offices. Oral antifungal medications.  With an 80-90% cure rate, the most common oral medication requires 3 to 4 months of therapy and stays in your system for a year as the new nail grows out.  Oral antifungal medications do require blood work to make sure it is a safe drug for you.  A liver function panel will be performed prior to starting  the medication and after the first month of treatment.  It is important to have the blood work performed to avoid any harmful side effects.  In general, this medication safe but blood work is required. Laser Therapy.  This treatment is performed by applying a specialized laser to the affected nail plate.  This therapy is noninvasive, fast, and non-painful.  It is not covered by insurance and is therefore, out of pocket.  The results have been very good with a 80-95% cure rate.  The [facility] is the only practice in the area to offer this therapy. Permanent Nail Avulsion.  Removing the entire nail so that a new nail will not grow back.

## 2021-03-29 DIAGNOSIS — E7800 Pure hypercholesterolemia, unspecified: Secondary | ICD-10-CM | POA: Diagnosis not present

## 2021-03-31 ENCOUNTER — Encounter: Payer: Self-pay | Admitting: Podiatry

## 2021-03-31 NOTE — Progress Notes (Signed)
Subjective: Hannah Nicholson presents today referred by Lawerance Cruel, MD for diabetic foot evaluation.  Patient relates 6 year history of diabetes.  Patient denies any history of foot wounds.  Patient has been diagnosed with chemotherapy induced neuropathy. She has h/o breast cancer.  Patient does not routinely mointor blood glucose.  PCP is Lawerance Cruel, MD , and last visit was 03/26/2021.  Today, patient c/o of discolored, thick toenails which interfere with daily activities.  Pain is aggravated when wearing enclosed shoe gear. Duration of condition: about  one month.   Patient states she played a game of pickleball and had pain in her feet the next morning. She denies any trauma to feet.  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   Sleep disturbance    Stress reaction    Unspecified asthma(493.90)    Vaginal atrophy    Vitamin D deficiency     Patient Active Problem List   Diagnosis Date Noted   Acute stress disorder 03/28/2021   Atrophy of vagina 03/28/2021   Breast cancer in situ 03/28/2021   Daytime somnolence 03/28/2021   Dyspareunia not due to a substance or known physiological condition 03/28/2021   Essential hypertension 03/28/2021   Fatigue 03/28/2021   Gout 03/28/2021   Hypertensive retinopathy 03/28/2021   Hypothyroidism 03/28/2021   Low back pain 03/28/2021   Nontoxic single thyroid  nodule 03/28/2021   Obesity 03/28/2021   Obstructive sleep apnea syndrome 03/28/2021   Personal history of colonic polyps 03/28/2021   Personal history of malignant neoplasm of breast 03/28/2021   Pure hypercholesterolemia 03/28/2021   Sleep disturbance 03/28/2021   Type 2 diabetes mellitus with other specified complication ([place]) 45/85/9292   Vitamin B12 deficiency 03/28/2021   Vitamin D deficiency 03/28/2021   Snoring 10/05/2020   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   Witnessed episode of apnea 10/05/2020   Excessive sleepiness 10/05/2020   Left ureteral calculus 09/20/2019   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/05/2015   Atypical ductal hyperplasia of left breast 04/04/2015   Malignant neoplasm of upper-outer quadrant of left breast in female, estrogen receptor positive ([place]) 11/21/2011   Conjunctivitis of both eyes 11/19/2010   SARCOIDOSIS 09/06/2008   Seasonal and perennial allergic rhinitis 09/06/2008   Asthma with acute exacerbation 09/06/2008   ESOPHAGEAL REFLUX 09/06/2008    Past Surgical History:  Procedure Laterality Date   BREAST LUMPECTOMY Left 2013   BREAST CANCER AND RADIATION, NO CHEMO   BREAST SURGERY Left 2017   chemo done in 2017, mastectomy   CHOLECYSTECTOMY     COLONOSCOPY     TUBULAR ADENOMA   CYSTOSCOPY W/ URETERAL STENT PLACEMENT Left 09/20/2019   Procedure: CYSTOSCOPY WITH RETROGRADE PYELOGRAM/URETERAL STENT PLACEMENT;  Surgeon: Robley Fries, MD;  Location: [facility];  Service: Urology;  Laterality: Left;   CYSTOSCOPY/URETEROSCOPY/HOLMIUM LASER/STENT PLACEMENT Left 10/19/2019   Procedure: CYSTOSCOPY LEFT URETEROSCOPY/HOLMIUM LASER/STENT EXHANGE;  Surgeon: Robley Fries, MD;  Location: [facility];  Service: Urology;  Laterality: Left;   HEMORRHOID SURGERY     LAPAROSCOPIC INGUINAL HERNIA WITH UMBILICAL HERNIA     MASTECTOMY Left 2016   sinus surgery     TONSILLECTOMY     TOTAL ABDOMINAL HYSTERECTOMY     partial    TUBAL LIGATION      Current Outpatient Medications on File Prior to Visit  Medication Sig Dispense Refill   fexofenadine (ALLEGRA) 180 MG tablet Take 180 mg by mouth daily as needed for allergies.      Ascorbic Acid (VITAMIN C) 500 MG CAPS See admin instructions.     benzonatate (TESSALON) 100 MG capsule Take 100-200 mg by mouth 3 (three) times daily as needed.     BINAXNOW COVID-19 AG HOME TEST KIT Use as Directed on the Package     calcium carbonate (OS-CAL - DOSED IN MG OF ELEMENTAL CALCIUM) 1250 (500 Ca) MG tablet Take 1 tablet by mouth daily with breakfast. Not taking (Patient not taking: Reported on 03/28/2021)     Calcium Citrate-Vitamin D 315-5 MG-MCG TABS Take 1 tablet by mouth 2 (two) times daily.     cyanocobalamin (,VITAMIN B-12,) 1000 MCG/ML injection Inject 1,000 mcg into the muscle every 30 (thirty) days.     Cyanocobalamin (VITAMIN B12) 1000 MCG TBCR 1 tablet     glimepiride (AMARYL) 2 MG tablet Take 2 mg by mouth daily.     hydrochlorothiazide (HYDRODIURIL) 25 MG tablet Take 1 tablet by mouth daily as needed (fluid).      indomethacin (INDOCIN) 50 MG capsule Take by mouth.     Lactobacillus Rhamnosus, GG, (RA PROBIOTIC DIGESTIVE CARE) CAPS Take 1 capsule by mouth daily.     LAGEVRIO 200 MG CAPS capsule SMARTSIG:4 Capsule(s) By Mouth Every 12 Hours     metFORMIN (GLUCOPHAGE-XR) 500 MG 24 hr tablet Take 1,000 mg by mouth 2 (two) times daily.     metFORMIN (GLUCOPHAGE-XR) 500 MG 24 hr tablet 1 tablet     metoprolol succinate (TOPROL-XL) 25 MG 24 hr tablet Take 1 tablet by mouth daily.     Omega-3 1000 MG CAPS Take by mouth.     ONETOUCH ULTRA test strip 1 each daily.     pantoprazole (PROTONIX) 40 MG tablet Take by mouth.     sertraline (ZOLOFT) 50 MG tablet Take 50 mg by mouth daily.     SYNTHROID 50 MCG tablet Take 50 mcg by mouth daily.     telmisartan (MICARDIS) 80 MG tablet Take 1 tablet by mouth daily. Not taking     vitamin B-12 (CYANOCOBALAMIN) 1000 MCG tablet Take  1,000 mcg by mouth daily.     Vitamin D, Ergocalciferol, (DRISDOL) 1.25 MG (50000 UNIT) CAPS capsule Take 50,000 Units by mouth once a week.     Zinc 50 MG TABS 1 tablet     No current facility-administered medications on file prior to visit.     Allergies  Allergen Reactions   Iodinated Diagnostic Agents Anaphylaxis and Other (See Comments)    Passes out Other reaction(s): Other (See Comments) Patient passed out   Iodine Anaphylaxis and Other (See Comments)    Passes out, IV dye   Amlodipine Besylate     MAKES BLOOD SUGAR GO UP   Hydrochlorothiazide     GOUT  Lisinopril Cough   Melatonin     Other reaction(s): nightmares    Social History   Occupational History   Occupation: Web designer: [employer]  Tobacco Use   Smoking status: Never   Smokeless tobacco: Never  Vaping Use   Vaping Use: Never used  Substance and Sexual Activity   Alcohol use: No   Drug use: No   Sexual activity: Not on file    Family History  Problem Relation Age of Onset   Asthma Father    Heart disease Mother    Prostate cancer Brother 19   Prostate cancer Maternal Uncle        dx 52s   Lupus Sister    Breast cancer Sister 47   Stroke Sister 45   Prostate cancer Maternal Uncle        dx 73s   Lung cancer Cousin        thought to be due to sarcoidosis    Immunization History  Administered Date(s) Administered   Influenza Split 01/26/2012   Influenza,inj,Quad PF,6+ Mos 02/05/2013    Objective: There were no vitals filed for this visit.  Hannah Nicholson is a pleasant 70 y.o. female WD, WN in NAD. AAO X 3.  Vascular Examination: CFT immediate b/l LE. Palpable DP/PT pulses b/l LE. Digital hair sparse b/l. Skin temperature gradient WNL b/l. No pain with calf compression b/l. No edema noted b/l. Lower extremity skin temperature gradient within normal limits.  Dermatological Examination: Pedal integument with normal turgor, texture and tone b/l LE. No open  wounds b/l. No interdigital macerations b/l. Toenails bilateral great toes elongated, thickened, discolored with subungual debris. +Tenderness with dorsal palpation of nailplates. No hyperkeratotic or porokeratotic lesions present. Toenails 1-5 b/l elongated, discolored, dystrophic, thickened, crumbly with subungual debris and tenderness to dorsal palpation.  Musculoskeletal Examination: Normal muscle strength 5/5 to all lower extremity muscle groups bilaterally. Pes planus deformity noted b/l lower extremities. Pain on palpation medial tubercle right heel.  Footwear Assessment: Does the patient wear appropriate shoes? Yes. Does the patient need inserts/orthotics? Yes.  Neurological Examination: Pt has subjective symptoms of neuropathy. Protective sensation intact 5/5 intact bilaterally with 10g monofilament b/l. Vibratory sensation intact b/l.  Assessment: 1. Pain due to onychomycosis of toenails of both feet   2. Plantar fasciitis, bilateral   3. Pes planus of both feet   4. Chemotherapy-induced neuropathy ([place])   5. Type 2 diabetes mellitus with other specified complication, without long-term current use of insulin ([place])   6. Encounter for diabetic foot exam ([place])     ADA Risk Categorization:  Low Risk:  Patient has all of the following: Intact protective sensation No prior foot ulcer  No severe deformity Pedal pulses present  Plan: -Examined patient. -Discussed plantar fasciitis and treatment options. She would like to try PowerStep Arch supports. -Diabetic foot examination performed today. -Discussed treatment options for onychomycosis. Patient opted for topical topical therapy. Patient is to apply Formula 7 Emulsion to affected toenail(s) once daily. -Toenails 1-5 b/l were debrided in length and girth with sterile nail nippers and dremel without iatrogenic bleeding.  -Patient/POA to call should there be question/concern in the interim.  Return in about 3 months (around  06/28/2021).  Marzetta Board, DPM

## 2021-04-03 DIAGNOSIS — R490 Dysphonia: Secondary | ICD-10-CM | POA: Diagnosis not present

## 2021-04-03 DIAGNOSIS — J384 Edema of larynx: Secondary | ICD-10-CM | POA: Diagnosis not present

## 2021-04-03 DIAGNOSIS — J383 Other diseases of vocal cords: Secondary | ICD-10-CM | POA: Diagnosis not present

## 2021-04-05 ENCOUNTER — Inpatient Hospital Stay: Payer: Medicare HMO | Admitting: Adult Health

## 2021-04-11 ENCOUNTER — Encounter (HOSPITAL_COMMUNITY): Payer: Self-pay

## 2021-04-17 DIAGNOSIS — H25812 Combined forms of age-related cataract, left eye: Secondary | ICD-10-CM | POA: Diagnosis not present

## 2021-04-17 DIAGNOSIS — I10 Essential (primary) hypertension: Secondary | ICD-10-CM | POA: Diagnosis not present

## 2021-04-17 DIAGNOSIS — H5203 Hypermetropia, bilateral: Secondary | ICD-10-CM | POA: Diagnosis not present

## 2021-05-15 DIAGNOSIS — I10 Essential (primary) hypertension: Secondary | ICD-10-CM | POA: Diagnosis not present

## 2021-05-15 DIAGNOSIS — E1169 Type 2 diabetes mellitus with other specified complication: Secondary | ICD-10-CM | POA: Diagnosis not present

## 2021-06-07 ENCOUNTER — Inpatient Hospital Stay: Payer: Medicare HMO | Admitting: Adult Health

## 2021-06-19 ENCOUNTER — Ambulatory Visit
Admission: RE | Admit: 2021-06-19 | Discharge: 2021-06-19 | Disposition: A | Discharge status: Home / Self Care | Payer: Medicare HMO | Source: Ambulatory Visit | Attending: Family Medicine | Admitting: Family Medicine

## 2021-06-19 ENCOUNTER — Other Ambulatory Visit: Payer: Self-pay | Admitting: Family Medicine

## 2021-06-19 DIAGNOSIS — R079 Chest pain, unspecified: Secondary | ICD-10-CM

## 2021-06-19 DIAGNOSIS — R059 Cough, unspecified: Secondary | ICD-10-CM | POA: Diagnosis not present

## 2021-06-19 DIAGNOSIS — K30 Functional dyspepsia: Secondary | ICD-10-CM | POA: Diagnosis not present

## 2021-07-09 ENCOUNTER — Ambulatory Visit: Payer: Medicare HMO | Admitting: Podiatry

## 2021-07-09 ENCOUNTER — Other Ambulatory Visit: Payer: Self-pay

## 2021-07-09 DIAGNOSIS — E1169 Type 2 diabetes mellitus with other specified complication: Secondary | ICD-10-CM

## 2021-07-09 DIAGNOSIS — T451X5A Adverse effect of antineoplastic and immunosuppressive drugs, initial encounter: Secondary | ICD-10-CM

## 2021-07-09 DIAGNOSIS — B351 Tinea unguium: Secondary | ICD-10-CM | POA: Diagnosis not present

## 2021-07-09 DIAGNOSIS — M79674 Pain in right toe(s): Secondary | ICD-10-CM

## 2021-07-09 DIAGNOSIS — M79675 Pain in left toe(s): Secondary | ICD-10-CM

## 2021-07-09 DIAGNOSIS — G620 Drug-induced polyneuropathy: Secondary | ICD-10-CM

## 2021-07-13 DIAGNOSIS — Z853 Personal history of malignant neoplasm of breast: Secondary | ICD-10-CM | POA: Diagnosis not present

## 2021-07-13 DIAGNOSIS — Z01419 Encounter for gynecological examination (general) (routine) without abnormal findings: Secondary | ICD-10-CM | POA: Diagnosis not present

## 2021-07-13 DIAGNOSIS — N907 Vulvar cyst: Secondary | ICD-10-CM | POA: Diagnosis not present

## 2021-07-13 DIAGNOSIS — N838 Other noninflammatory disorders of ovary, fallopian tube and broad ligament: Secondary | ICD-10-CM | POA: Diagnosis not present

## 2021-07-15 ENCOUNTER — Encounter: Payer: Self-pay | Admitting: Podiatry

## 2021-07-15 NOTE — Progress Notes (Signed)
°  Subjective:  Patient ID: Hannah Nicholson, female    DOB: 29-Sep-1950,  MRN: [id number]  Hannah Nicholson presents to clinic today for at risk foot care with history of chemotherapy induced neuropathy and painful elongated mycotic toenails 1-5 bilaterally which are tender when wearing enclosed shoe gear. Pain is relieved with periodic professional debridement.  Patient states blood glucose was 145 mg/dl today.  Last A1c was 6/2%.  New problem(s): None.   PCP is Lawerance Cruel, MD , and last visit was June 21, 2021.  Allergies  Allergen Reactions   Iodinated Contrast Media Anaphylaxis and Other (See Comments)    Passes out Other reaction(s): Other (See Comments) Patient passed out   Iodine Anaphylaxis and Other (See Comments)    Passes out, IV dye   Amlodipine Besylate     MAKES BLOOD SUGAR GO UP   Hydrochlorothiazide     GOUT   Lisinopril Cough   Melatonin     Other reaction(s): nightmares    Review of Systems: Negative except as noted in the HPI. Objective:   Constitutional Hannah Nicholson is a pleasant 71 y.o. African American female, in NAD. AAO x 3.   Vascular Capillary refill time to digits immediate b/l. Palpable pedal pulses b/l LE. Pedal hair sparse. No pain with calf compression b/l. Lower extremity skin temperature gradient within normal limits. No edema noted b/l LE. No cyanosis or clubbing noted b/l LE.  Neurologic Normal speech. Oriented to person, place, and time. Pt has subjective symptoms of neuropathy. Protective sensation intact 5/5 intact bilaterally with 10g monofilament b/l.  Dermatologic Pedal integument with normal turgor, texture and tone BLE. No open wounds b/l LE. No interdigital macerations noted b/l LE. Toenail(s) 1-5 bilaterally elongated, discolored, dystrophic, thickened >1/4 inch. Nails are crumbly with subungual debris and there is exquisite tenderness to dorsal palpation. No subungual wound(s) noted.  Orthopedic: Muscle strength  5/5 to all lower extremity muscle groups bilaterally. Pes planus deformity noted bilateral LE.   Radiographs: None  Assessment:   1. Pain due to onychomycosis of toenails of both feet   2. Chemotherapy-induced neuropathy ([place])   3. Type 2 diabetes mellitus with other specified complication, without long-term current use of insulin ([place])    Plan:  Patient was evaluated and treated and all questions answered. Consent given for treatment as described below: -Mycotic toenails were debrided in length and girth 1-5 bilaterally with sterile nail nippers and dremel without iatrogenic bleeding. Continue daily use Formula 7 Emulsion to affected toenails. -Patient/POA to call should there be question/concern in the interim.  Return in about 3 months (around 10/06/2021).  Marzetta Board, DPM

## 2021-07-17 DIAGNOSIS — R1084 Generalized abdominal pain: Secondary | ICD-10-CM | POA: Diagnosis not present

## 2021-07-17 DIAGNOSIS — C50912 Malignant neoplasm of unspecified site of left female breast: Secondary | ICD-10-CM | POA: Diagnosis not present

## 2021-07-17 DIAGNOSIS — N200 Calculus of kidney: Secondary | ICD-10-CM | POA: Diagnosis not present

## 2021-07-23 DIAGNOSIS — N838 Other noninflammatory disorders of ovary, fallopian tube and broad ligament: Secondary | ICD-10-CM | POA: Diagnosis not present

## 2021-07-27 ENCOUNTER — Other Ambulatory Visit: Payer: Self-pay | Admitting: Urology

## 2021-07-31 NOTE — Progress Notes (Signed)
Left message for pt to call back  °

## 2021-07-31 NOTE — Progress Notes (Signed)
Pt called back . Instructions and hx reviewed. Husband is the driver. Clear liquids until 1230, solids until 1030. Arrival time 1430. To take b/p meds and DM meds day of surgery ?

## 2021-08-02 ENCOUNTER — Ambulatory Visit (HOSPITAL_BASED_OUTPATIENT_CLINIC_OR_DEPARTMENT_OTHER)
Admission: RE | Admit: 2021-08-02 | Discharge: 2021-08-02 | Disposition: A | Discharge status: Home / Self Care | Payer: Medicare HMO | Attending: Urology | Admitting: Urology

## 2021-08-02 ENCOUNTER — Other Ambulatory Visit: Payer: Self-pay

## 2021-08-02 ENCOUNTER — Encounter (HOSPITAL_BASED_OUTPATIENT_CLINIC_OR_DEPARTMENT_OTHER)
Admission: RE | Disposition: A | Discharge status: Home / Self Care | Payer: Self-pay | Source: Home / Self Care | Attending: Urology

## 2021-08-02 ENCOUNTER — Ambulatory Visit (HOSPITAL_COMMUNITY): Payer: Medicare HMO

## 2021-08-02 ENCOUNTER — Encounter (HOSPITAL_BASED_OUTPATIENT_CLINIC_OR_DEPARTMENT_OTHER): Payer: Self-pay | Admitting: Urology

## 2021-08-02 DIAGNOSIS — I878 Other specified disorders of veins: Secondary | ICD-10-CM | POA: Diagnosis not present

## 2021-08-02 DIAGNOSIS — N200 Calculus of kidney: Secondary | ICD-10-CM

## 2021-08-02 DIAGNOSIS — E119 Type 2 diabetes mellitus without complications: Secondary | ICD-10-CM | POA: Insufficient documentation

## 2021-08-02 HISTORY — PX: EXTRACORPOREAL SHOCK WAVE LITHOTRIPSY: SHX1557

## 2021-08-02 LAB — GLUCOSE, CAPILLARY: Glucose-Capillary: 81 mg/dL (ref 70–99)

## 2021-08-02 SURGERY — LITHOTRIPSY, ESWL
Anesthesia: LOCAL | Laterality: Right

## 2021-08-02 MED ORDER — DIPHENHYDRAMINE HCL 25 MG PO CAPS
25.0000 mg | ORAL_CAPSULE | ORAL | Status: AC
  Administered 2021-08-02: 16:00:00 25 mg via ORAL

## 2021-08-02 MED ORDER — DIAZEPAM 5 MG PO TABS
ORAL_TABLET | ORAL | Status: AC
Start: 2021-08-02 — End: ?
  Filled 2021-08-02: qty 2

## 2021-08-02 MED ORDER — OXYCODONE HCL 5 MG PO TABS: 5.0000 mg | ORAL_TABLET | Freq: Four times a day (QID) | ORAL | 0 refills | Status: AC | PRN

## 2021-08-02 MED ORDER — TAMSULOSIN HCL 0.4 MG PO CAPS: 0.4000 mg | ORAL_CAPSULE | Freq: Every day | ORAL | 0 refills | Status: AC

## 2021-08-02 MED ORDER — DIAZEPAM 5 MG PO TABS
10.0000 mg | ORAL_TABLET | ORAL | Status: AC
  Administered 2021-08-02: 16:00:00 10 mg via ORAL

## 2021-08-02 MED ORDER — CIPROFLOXACIN HCL 500 MG PO TABS
ORAL_TABLET | ORAL | Status: AC
  Filled 2021-08-02: qty 1

## 2021-08-02 MED ORDER — DIPHENHYDRAMINE HCL 25 MG PO CAPS
ORAL_CAPSULE | ORAL | Status: AC
  Filled 2021-08-02: qty 1

## 2021-08-02 MED ORDER — SODIUM CHLORIDE 0.9 % IV SOLN
INTRAVENOUS | Status: DC
Start: 2021-08-02 — End: 2021-08-02

## 2021-08-02 MED ORDER — CIPROFLOXACIN HCL 500 MG PO TABS
500.0000 mg | ORAL_TABLET | ORAL | Status: AC
  Administered 2021-08-02: 16:00:00 500 mg via ORAL

## 2021-08-02 NOTE — Op Note (Signed)
See [facility] OP note scanned into chart. Also because of the size, density, location and other factors that cannot be anticipated I feel this will likely be a staged procedure. This fact supersedes any indication in the scanned [facility] operative note to the contrary.  

## 2021-08-02 NOTE — H&P (Signed)
See [facility] scanned into Epic 

## 2021-08-02 NOTE — Discharge Instructions (Addendum)
See [facility] discharge instructions in chart.   Post Anesthesia Home Care Instructions  Activity: Get plenty of rest for the remainder of the day. A responsible individual must stay with you for 24 hours following the procedure.  For the next 24 hours, DO NOT: -Drive a car -Operate machinery -Drink alcoholic beverages -Take any medication unless instructed by your physician -Make any legal decisions or sign important papers.  Meals: Start with liquid foods such as gelatin or soup. Progress to regular foods as tolerated. Avoid greasy, spicy, heavy foods. If nausea and/or vomiting occur, drink only clear liquids until the nausea and/or vomiting subsides. Call your physician if vomiting continues.  Special Instructions/Symptoms: Your throat may feel dry or sore from the anesthesia or the breathing tube placed in your throat during surgery. If this causes discomfort, gargle with warm salt water. The discomfort should disappear within 24 hours.       

## 2021-08-03 ENCOUNTER — Encounter (HOSPITAL_BASED_OUTPATIENT_CLINIC_OR_DEPARTMENT_OTHER): Payer: Self-pay | Admitting: Urology

## 2021-08-14 DIAGNOSIS — E559 Vitamin D deficiency, unspecified: Secondary | ICD-10-CM | POA: Diagnosis not present

## 2021-08-14 DIAGNOSIS — E039 Hypothyroidism, unspecified: Secondary | ICD-10-CM | POA: Diagnosis not present

## 2021-08-14 DIAGNOSIS — E042 Nontoxic multinodular goiter: Secondary | ICD-10-CM | POA: Diagnosis not present

## 2021-08-14 DIAGNOSIS — E119 Type 2 diabetes mellitus without complications: Secondary | ICD-10-CM | POA: Diagnosis not present

## 2021-08-17 DIAGNOSIS — N200 Calculus of kidney: Secondary | ICD-10-CM | POA: Diagnosis not present

## 2021-09-05 DIAGNOSIS — R350 Frequency of micturition: Secondary | ICD-10-CM | POA: Diagnosis not present

## 2021-09-05 DIAGNOSIS — R3915 Urgency of urination: Secondary | ICD-10-CM | POA: Diagnosis not present

## 2021-09-05 DIAGNOSIS — N200 Calculus of kidney: Secondary | ICD-10-CM | POA: Diagnosis not present

## 2021-09-11 DIAGNOSIS — H35032 Hypertensive retinopathy, left eye: Secondary | ICD-10-CM | POA: Diagnosis not present

## 2021-09-17 DIAGNOSIS — E7800 Pure hypercholesterolemia, unspecified: Secondary | ICD-10-CM | POA: Diagnosis not present

## 2021-09-17 DIAGNOSIS — E538 Deficiency of other specified B group vitamins: Secondary | ICD-10-CM | POA: Diagnosis not present

## 2021-09-17 DIAGNOSIS — E559 Vitamin D deficiency, unspecified: Secondary | ICD-10-CM | POA: Diagnosis not present

## 2021-09-17 DIAGNOSIS — M109 Gout, unspecified: Secondary | ICD-10-CM | POA: Diagnosis not present

## 2021-09-17 DIAGNOSIS — E1169 Type 2 diabetes mellitus with other specified complication: Secondary | ICD-10-CM | POA: Diagnosis not present

## 2021-09-20 ENCOUNTER — Other Ambulatory Visit: Payer: Self-pay | Admitting: Family Medicine

## 2021-09-20 DIAGNOSIS — E559 Vitamin D deficiency, unspecified: Secondary | ICD-10-CM | POA: Diagnosis not present

## 2021-09-20 DIAGNOSIS — I10 Essential (primary) hypertension: Secondary | ICD-10-CM | POA: Diagnosis not present

## 2021-09-20 DIAGNOSIS — E1169 Type 2 diabetes mellitus with other specified complication: Secondary | ICD-10-CM | POA: Diagnosis not present

## 2021-09-20 DIAGNOSIS — K219 Gastro-esophageal reflux disease without esophagitis: Secondary | ICD-10-CM | POA: Diagnosis not present

## 2021-09-20 DIAGNOSIS — E039 Hypothyroidism, unspecified: Secondary | ICD-10-CM | POA: Diagnosis not present

## 2021-09-20 DIAGNOSIS — E7800 Pure hypercholesterolemia, unspecified: Secondary | ICD-10-CM

## 2021-09-20 DIAGNOSIS — Z0000 Encounter for general adult medical examination without abnormal findings: Secondary | ICD-10-CM | POA: Diagnosis not present

## 2021-09-20 DIAGNOSIS — E538 Deficiency of other specified B group vitamins: Secondary | ICD-10-CM | POA: Diagnosis not present

## 2021-09-20 DIAGNOSIS — E669 Obesity, unspecified: Secondary | ICD-10-CM | POA: Diagnosis not present

## 2021-10-08 ENCOUNTER — Encounter: Payer: Self-pay | Admitting: Podiatry

## 2021-10-08 ENCOUNTER — Ambulatory Visit: Payer: Medicare HMO | Admitting: Podiatry

## 2021-10-08 DIAGNOSIS — E1169 Type 2 diabetes mellitus with other specified complication: Secondary | ICD-10-CM | POA: Diagnosis not present

## 2021-10-08 DIAGNOSIS — G620 Drug-induced polyneuropathy: Secondary | ICD-10-CM

## 2021-10-08 DIAGNOSIS — B351 Tinea unguium: Secondary | ICD-10-CM | POA: Diagnosis not present

## 2021-10-08 DIAGNOSIS — M79674 Pain in right toe(s): Secondary | ICD-10-CM

## 2021-10-08 DIAGNOSIS — T451X5A Adverse effect of antineoplastic and immunosuppressive drugs, initial encounter: Secondary | ICD-10-CM

## 2021-10-08 DIAGNOSIS — M79675 Pain in left toe(s): Secondary | ICD-10-CM | POA: Diagnosis not present

## 2021-10-14 NOTE — Progress Notes (Signed)
  Subjective:  Patient ID: Hannah Nicholson, female    DOB: 1950/10/10,  MRN: [id number]  Keymani Glynn presents to clinic today for at risk foot care with history of diabetes and chemotherapy induced neuropathy and painful elongated overgrown toenails which are tender when wearing enclosed shoe gear.  Patient states blood glucose was 138 mg/dl today.  Last known HgA1c was 6.2%.  New problem(s): None.   PCP is Lawerance Cruel, MD , and last visit was September 20, 2021.  Allergies  Allergen Reactions   Iodinated Contrast Media Anaphylaxis and Other (See Comments)    Passes out Other reaction(s): Other (See Comments) Patient passed out Other reaction(s): anaphylaxis   Iodine Anaphylaxis and Other (See Comments)    Passes out, IV dye   Amlodipine Besylate     MAKES BLOOD SUGAR GO UP   Hydrochlorothiazide     GOUT   Lisinopril Cough   Melatonin     Other reaction(s): nightmares    Review of Systems: Negative except as noted in the HPI.  Objective: No changes noted in today's physical examination. Constitutional Hannah Nicholson is a pleasant 71 y.o. African American female, WD, WN in NAD. AAO x 3.   Vascular Capillary refill time to digits immediate b/l. Palpable pedal pulses b/l LE. Pedal hair sparse. No pain with calf compression b/l. Lower extremity skin temperature gradient within normal limits. No edema noted b/l LE. No cyanosis or clubbing noted b/l LE.  Neurologic Normal speech. Oriented to person, place, and time. Pt has subjective symptoms of neuropathy. Protective sensation intact 5/5 intact bilaterally with 10g monofilament b/l.  Dermatologic Pedal integument with normal turgor, texture and tone BLE. No open wounds b/l LE. No interdigital macerations noted b/l LE. Toenail(s) 1-5 bilaterally elongated, discolored, dystrophic, thickened >1/4 inch. Nails are crumbly with subungual debris and there is exquisite tenderness to dorsal palpation. No subungual wound(s)  noted.  Orthopedic: Muscle strength 5/5 to all lower extremity muscle groups bilaterally. Pes planus deformity noted bilateral LE.   Radiographs: None   Assessment/Plan: 1. Pain due to onychomycosis of toenails of both feet   2. Chemotherapy-induced neuropathy ([place])   3. Type 2 diabetes mellitus with other specified complication, without long-term current use of insulin ([place])     -Patient was evaluated and treated. All patient's and/or POA's questions/concerns answered on today's visit. -No new findings. No new orders. -Patient to continue soft, supportive shoe gear daily. -Toenails 1-5 b/l were debrided in length and girth with sterile nail nippers and dremel without iatrogenic bleeding.  -Patient/POA to call should there be question/concern in the interim.   Return in about 3 months (around 01/08/2022).  Marzetta Board, DPM

## 2021-10-19 ENCOUNTER — Other Ambulatory Visit: Payer: Medicare HMO

## 2021-12-11 DIAGNOSIS — E119 Type 2 diabetes mellitus without complications: Secondary | ICD-10-CM | POA: Diagnosis not present

## 2021-12-11 DIAGNOSIS — Z79890 Hormone replacement therapy (postmenopausal): Secondary | ICD-10-CM | POA: Diagnosis not present

## 2021-12-11 DIAGNOSIS — Z7984 Long term (current) use of oral hypoglycemic drugs: Secondary | ICD-10-CM | POA: Diagnosis not present

## 2021-12-11 DIAGNOSIS — E039 Hypothyroidism, unspecified: Secondary | ICD-10-CM | POA: Diagnosis not present

## 2022-01-11 ENCOUNTER — Ambulatory Visit: Payer: Medicare HMO | Admitting: Podiatry

## 2022-02-11 DIAGNOSIS — E668 Other obesity: Secondary | ICD-10-CM | POA: Diagnosis not present

## 2022-02-11 DIAGNOSIS — R051 Acute cough: Secondary | ICD-10-CM | POA: Diagnosis not present

## 2022-02-11 DIAGNOSIS — Z03818 Encounter for observation for suspected exposure to other biological agents ruled out: Secondary | ICD-10-CM | POA: Diagnosis not present

## 2022-02-11 DIAGNOSIS — B001 Herpesviral vesicular dermatitis: Secondary | ICD-10-CM | POA: Diagnosis not present

## 2022-02-28 DIAGNOSIS — L989 Disorder of the skin and subcutaneous tissue, unspecified: Secondary | ICD-10-CM | POA: Diagnosis not present

## 2022-02-28 DIAGNOSIS — Z6832 Body mass index (BMI) 32.0-32.9, adult: Secondary | ICD-10-CM | POA: Diagnosis not present

## 2022-03-07 ENCOUNTER — Ambulatory Visit
Admission: RE | Admit: 2022-03-07 | Discharge: 2022-03-07 | Disposition: A | Discharge status: Home / Self Care | Payer: Medicare HMO | Source: Ambulatory Visit | Attending: Obstetrics and Gynecology | Admitting: Obstetrics and Gynecology

## 2022-03-07 DIAGNOSIS — Z1231 Encounter for screening mammogram for malignant neoplasm of breast: Secondary | ICD-10-CM

## 2022-03-20 DIAGNOSIS — Z86010 Personal history of colonic polyps: Secondary | ICD-10-CM | POA: Diagnosis not present

## 2022-03-20 DIAGNOSIS — K5900 Constipation, unspecified: Secondary | ICD-10-CM | POA: Diagnosis not present

## 2022-03-20 DIAGNOSIS — R1013 Epigastric pain: Secondary | ICD-10-CM | POA: Diagnosis not present

## 2022-03-20 DIAGNOSIS — K921 Melena: Secondary | ICD-10-CM | POA: Diagnosis not present

## 2022-03-21 DIAGNOSIS — J4520 Mild intermittent asthma, uncomplicated: Secondary | ICD-10-CM | POA: Diagnosis not present

## 2022-03-21 DIAGNOSIS — E669 Obesity, unspecified: Secondary | ICD-10-CM | POA: Diagnosis not present

## 2022-03-21 DIAGNOSIS — Z23 Encounter for immunization: Secondary | ICD-10-CM | POA: Diagnosis not present

## 2022-03-21 DIAGNOSIS — I10 Essential (primary) hypertension: Secondary | ICD-10-CM | POA: Diagnosis not present

## 2022-03-28 DIAGNOSIS — L680 Hirsutism: Secondary | ICD-10-CM | POA: Diagnosis not present

## 2022-03-28 DIAGNOSIS — D1801 Hemangioma of skin and subcutaneous tissue: Secondary | ICD-10-CM | POA: Diagnosis not present

## 2022-03-28 DIAGNOSIS — L731 Pseudofolliculitis barbae: Secondary | ICD-10-CM | POA: Diagnosis not present

## 2022-03-28 DIAGNOSIS — L720 Epidermal cyst: Secondary | ICD-10-CM | POA: Diagnosis not present

## 2022-03-28 DIAGNOSIS — D239 Other benign neoplasm of skin, unspecified: Secondary | ICD-10-CM | POA: Diagnosis not present

## 2022-03-28 DIAGNOSIS — L858 Other specified epidermal thickening: Secondary | ICD-10-CM | POA: Diagnosis not present

## 2022-03-28 DIAGNOSIS — L810 Postinflammatory hyperpigmentation: Secondary | ICD-10-CM | POA: Diagnosis not present

## 2022-04-09 DIAGNOSIS — E039 Hypothyroidism, unspecified: Secondary | ICD-10-CM | POA: Diagnosis not present

## 2022-04-09 DIAGNOSIS — E119 Type 2 diabetes mellitus without complications: Secondary | ICD-10-CM | POA: Diagnosis not present

## 2022-04-09 DIAGNOSIS — Z79890 Hormone replacement therapy (postmenopausal): Secondary | ICD-10-CM | POA: Diagnosis not present

## 2022-04-09 DIAGNOSIS — Z7985 Long-term (current) use of injectable non-insulin antidiabetic drugs: Secondary | ICD-10-CM | POA: Diagnosis not present

## 2022-04-17 DIAGNOSIS — H5213 Myopia, bilateral: Secondary | ICD-10-CM | POA: Diagnosis not present

## 2022-04-25 DIAGNOSIS — H524 Presbyopia: Secondary | ICD-10-CM | POA: Diagnosis not present

## 2022-04-25 DIAGNOSIS — H02052 Trichiasis without entropian right lower eyelid: Secondary | ICD-10-CM | POA: Diagnosis not present

## 2022-04-25 DIAGNOSIS — H5203 Hypermetropia, bilateral: Secondary | ICD-10-CM | POA: Diagnosis not present

## 2022-04-25 DIAGNOSIS — H52223 Regular astigmatism, bilateral: Secondary | ICD-10-CM | POA: Diagnosis not present

## 2022-04-25 DIAGNOSIS — H5712 Ocular pain, left eye: Secondary | ICD-10-CM | POA: Diagnosis not present

## 2022-06-18 ENCOUNTER — Other Ambulatory Visit: Payer: Self-pay | Admitting: Obstetrics and Gynecology

## 2022-06-18 DIAGNOSIS — Z1231 Encounter for screening mammogram for malignant neoplasm of breast: Secondary | ICD-10-CM

## 2022-06-19 DIAGNOSIS — C50912 Malignant neoplasm of unspecified site of left female breast: Secondary | ICD-10-CM | POA: Diagnosis not present

## 2022-06-20 DIAGNOSIS — E7800 Pure hypercholesterolemia, unspecified: Secondary | ICD-10-CM | POA: Diagnosis not present

## 2022-06-20 DIAGNOSIS — H5203 Hypermetropia, bilateral: Secondary | ICD-10-CM | POA: Diagnosis not present

## 2022-06-20 DIAGNOSIS — I10 Essential (primary) hypertension: Secondary | ICD-10-CM | POA: Diagnosis not present

## 2022-06-20 DIAGNOSIS — E119 Type 2 diabetes mellitus without complications: Secondary | ICD-10-CM | POA: Diagnosis not present

## 2022-06-20 DIAGNOSIS — H40019 Open angle with borderline findings, low risk, unspecified eye: Secondary | ICD-10-CM | POA: Diagnosis not present

## 2022-06-20 DIAGNOSIS — H53143 Visual discomfort, bilateral: Secondary | ICD-10-CM | POA: Diagnosis not present

## 2022-07-15 DIAGNOSIS — Z9189 Other specified personal risk factors, not elsewhere classified: Secondary | ICD-10-CM | POA: Diagnosis not present

## 2022-07-18 DIAGNOSIS — C50912 Malignant neoplasm of unspecified site of left female breast: Secondary | ICD-10-CM | POA: Diagnosis not present

## 2022-07-23 DIAGNOSIS — R059 Cough, unspecified: Secondary | ICD-10-CM | POA: Diagnosis not present

## 2022-07-23 DIAGNOSIS — J029 Acute pharyngitis, unspecified: Secondary | ICD-10-CM | POA: Diagnosis not present

## 2022-07-23 DIAGNOSIS — E119 Type 2 diabetes mellitus without complications: Secondary | ICD-10-CM | POA: Diagnosis not present

## 2022-07-23 DIAGNOSIS — R051 Acute cough: Secondary | ICD-10-CM | POA: Diagnosis not present

## 2022-07-23 DIAGNOSIS — Z03818 Encounter for observation for suspected exposure to other biological agents ruled out: Secondary | ICD-10-CM | POA: Diagnosis not present

## 2022-09-19 DIAGNOSIS — E039 Hypothyroidism, unspecified: Secondary | ICD-10-CM | POA: Diagnosis not present

## 2022-09-19 DIAGNOSIS — E7800 Pure hypercholesterolemia, unspecified: Secondary | ICD-10-CM | POA: Diagnosis not present

## 2022-09-19 DIAGNOSIS — I10 Essential (primary) hypertension: Secondary | ICD-10-CM | POA: Diagnosis not present

## 2022-09-19 DIAGNOSIS — E559 Vitamin D deficiency, unspecified: Secondary | ICD-10-CM | POA: Diagnosis not present

## 2022-09-19 DIAGNOSIS — E538 Deficiency of other specified B group vitamins: Secondary | ICD-10-CM | POA: Diagnosis not present

## 2022-09-26 DIAGNOSIS — E559 Vitamin D deficiency, unspecified: Secondary | ICD-10-CM | POA: Diagnosis not present

## 2022-09-26 DIAGNOSIS — J4520 Mild intermittent asthma, uncomplicated: Secondary | ICD-10-CM | POA: Diagnosis not present

## 2022-09-26 DIAGNOSIS — E1165 Type 2 diabetes mellitus with hyperglycemia: Secondary | ICD-10-CM | POA: Diagnosis not present

## 2022-09-26 DIAGNOSIS — E7800 Pure hypercholesterolemia, unspecified: Secondary | ICD-10-CM | POA: Diagnosis not present

## 2022-09-26 DIAGNOSIS — Z0000 Encounter for general adult medical examination without abnormal findings: Secondary | ICD-10-CM | POA: Diagnosis not present

## 2022-09-26 DIAGNOSIS — I10 Essential (primary) hypertension: Secondary | ICD-10-CM | POA: Diagnosis not present

## 2022-09-26 DIAGNOSIS — E538 Deficiency of other specified B group vitamins: Secondary | ICD-10-CM | POA: Diagnosis not present

## 2022-09-27 DIAGNOSIS — Z7985 Long-term (current) use of injectable non-insulin antidiabetic drugs: Secondary | ICD-10-CM | POA: Diagnosis not present

## 2022-09-27 DIAGNOSIS — E039 Hypothyroidism, unspecified: Secondary | ICD-10-CM | POA: Diagnosis not present

## 2022-09-27 DIAGNOSIS — E119 Type 2 diabetes mellitus without complications: Secondary | ICD-10-CM | POA: Diagnosis not present

## 2022-09-30 ENCOUNTER — Other Ambulatory Visit: Payer: Self-pay | Admitting: Family Medicine

## 2022-09-30 DIAGNOSIS — E2839 Other primary ovarian failure: Secondary | ICD-10-CM

## 2022-12-11 DIAGNOSIS — M545 Low back pain, unspecified: Secondary | ICD-10-CM | POA: Diagnosis not present

## 2022-12-11 DIAGNOSIS — E119 Type 2 diabetes mellitus without complications: Secondary | ICD-10-CM | POA: Diagnosis not present

## 2022-12-11 DIAGNOSIS — R300 Dysuria: Secondary | ICD-10-CM | POA: Diagnosis not present

## 2022-12-11 DIAGNOSIS — M62838 Other muscle spasm: Secondary | ICD-10-CM | POA: Diagnosis not present

## 2023-01-15 DIAGNOSIS — K5730 Diverticulosis of large intestine without perforation or abscess without bleeding: Secondary | ICD-10-CM | POA: Diagnosis not present

## 2023-01-15 DIAGNOSIS — Z86010 Personal history of colonic polyps: Secondary | ICD-10-CM | POA: Diagnosis not present

## 2023-01-15 DIAGNOSIS — Z09 Encounter for follow-up examination after completed treatment for conditions other than malignant neoplasm: Secondary | ICD-10-CM | POA: Diagnosis not present

## 2023-01-15 DIAGNOSIS — K648 Other hemorrhoids: Secondary | ICD-10-CM | POA: Diagnosis not present

## 2023-01-15 DIAGNOSIS — D125 Benign neoplasm of sigmoid colon: Secondary | ICD-10-CM | POA: Diagnosis not present

## 2023-01-17 DIAGNOSIS — D125 Benign neoplasm of sigmoid colon: Secondary | ICD-10-CM | POA: Diagnosis not present

## 2023-01-21 DIAGNOSIS — E1165 Type 2 diabetes mellitus with hyperglycemia: Secondary | ICD-10-CM | POA: Diagnosis not present

## 2023-01-21 DIAGNOSIS — J45909 Unspecified asthma, uncomplicated: Secondary | ICD-10-CM | POA: Diagnosis not present

## 2023-01-21 DIAGNOSIS — B379 Candidiasis, unspecified: Secondary | ICD-10-CM | POA: Diagnosis not present

## 2023-01-21 DIAGNOSIS — R5383 Other fatigue: Secondary | ICD-10-CM | POA: Diagnosis not present

## 2023-01-27 IMAGING — MG MM DIGITAL SCREENING UNILAT*R* W/ TOMO W/ CAD
4 series · 4 of 12 positions shown · non-contrast
Comparison: Previous exam(s).

CLINICAL DATA: Screening. History of left mastectomy.

EXAM:
DIGITAL SCREENING UNILATERAL RIGHT MAMMOGRAM WITH CAD AND
TOMOSYNTHESIS
TECHNIQUE: Right screening digital craniocaudal and mediolateral oblique
mammograms were obtained. Right screening digital breast
tomosynthesis was performed. The images were evaluated with
computer-aided detection.

[R CC synth-2D]
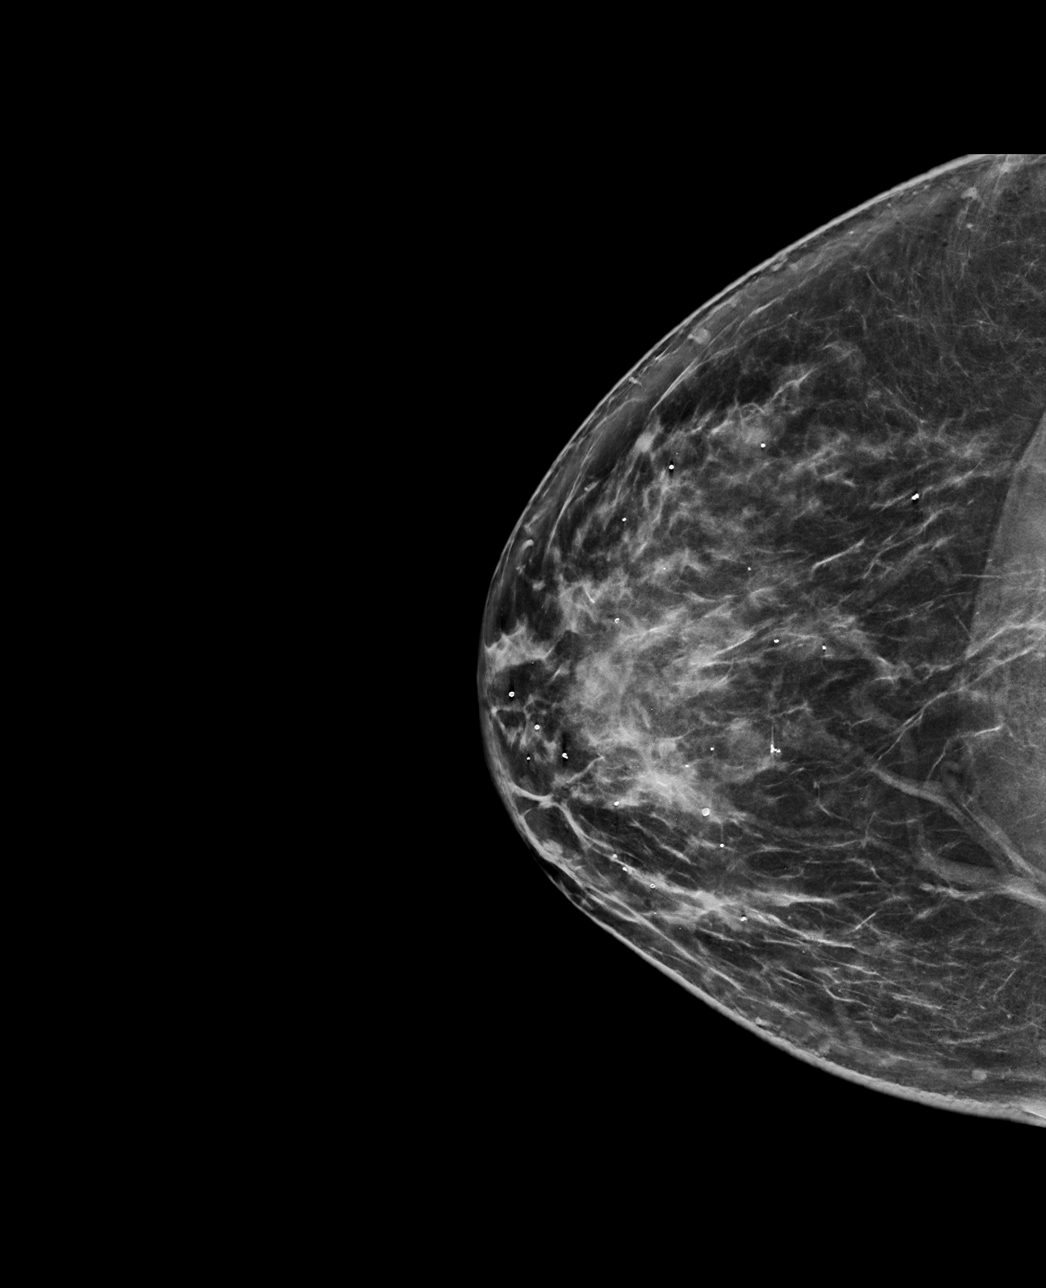

[R MLO synth-2D]
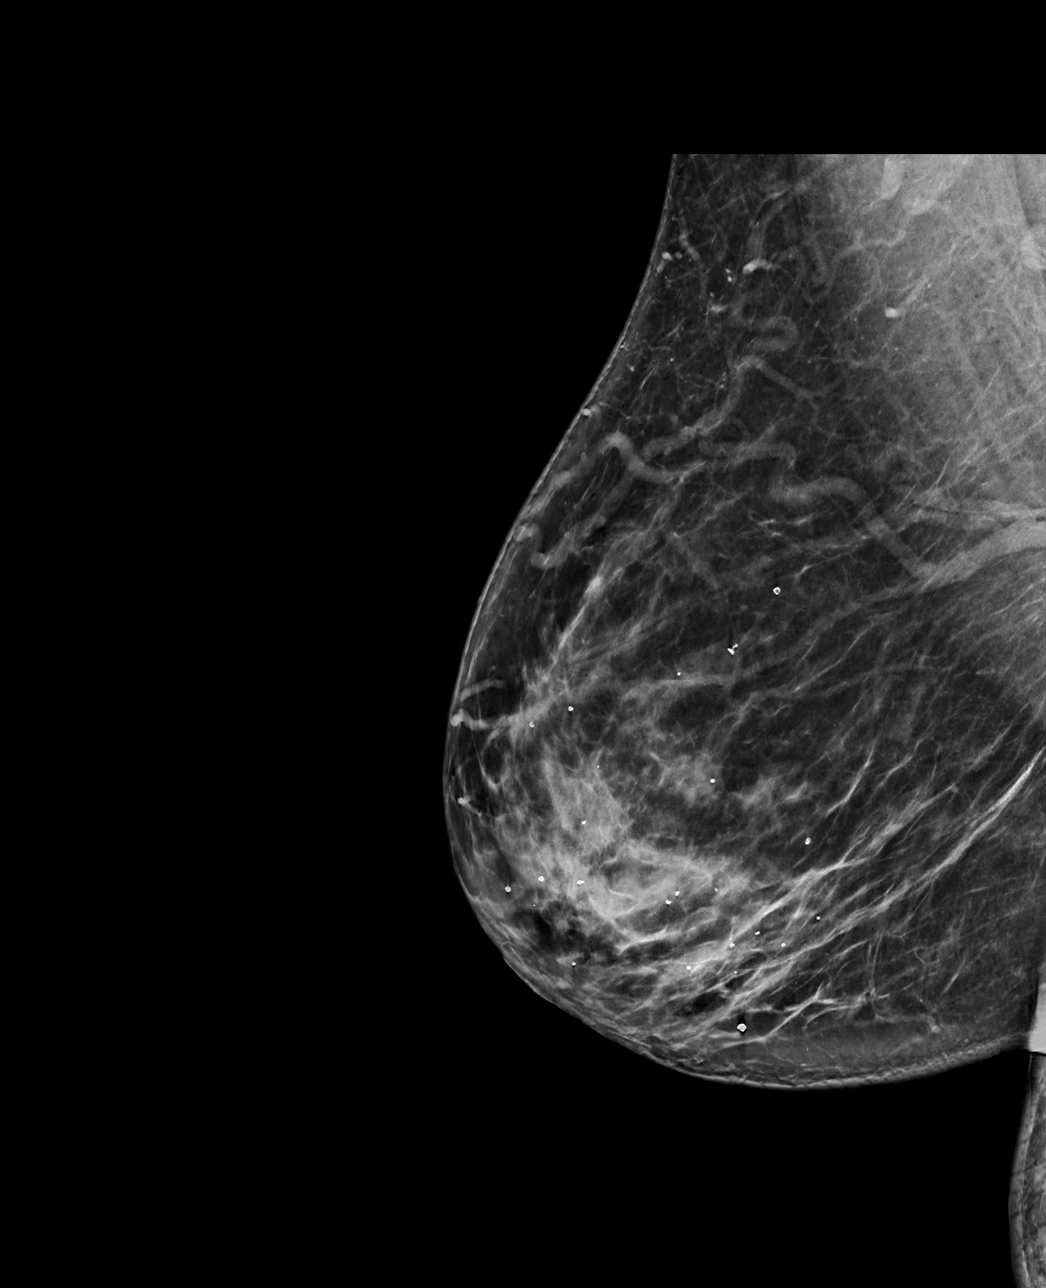

[R MLO tomo · tomo slice 43/84.0]
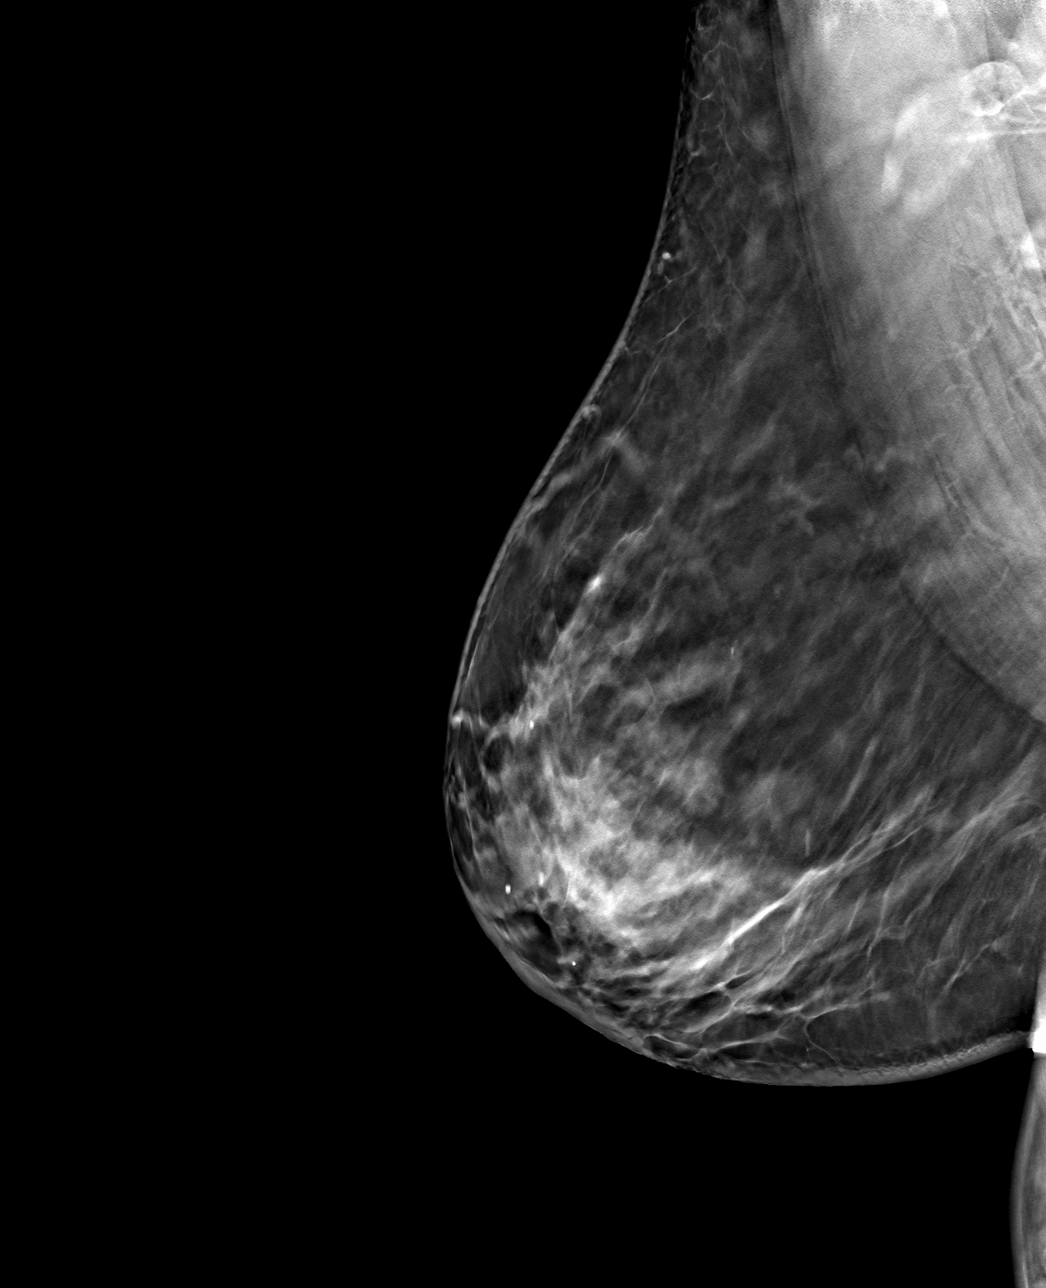

[R CC tomo · tomo slice 40/79.0]
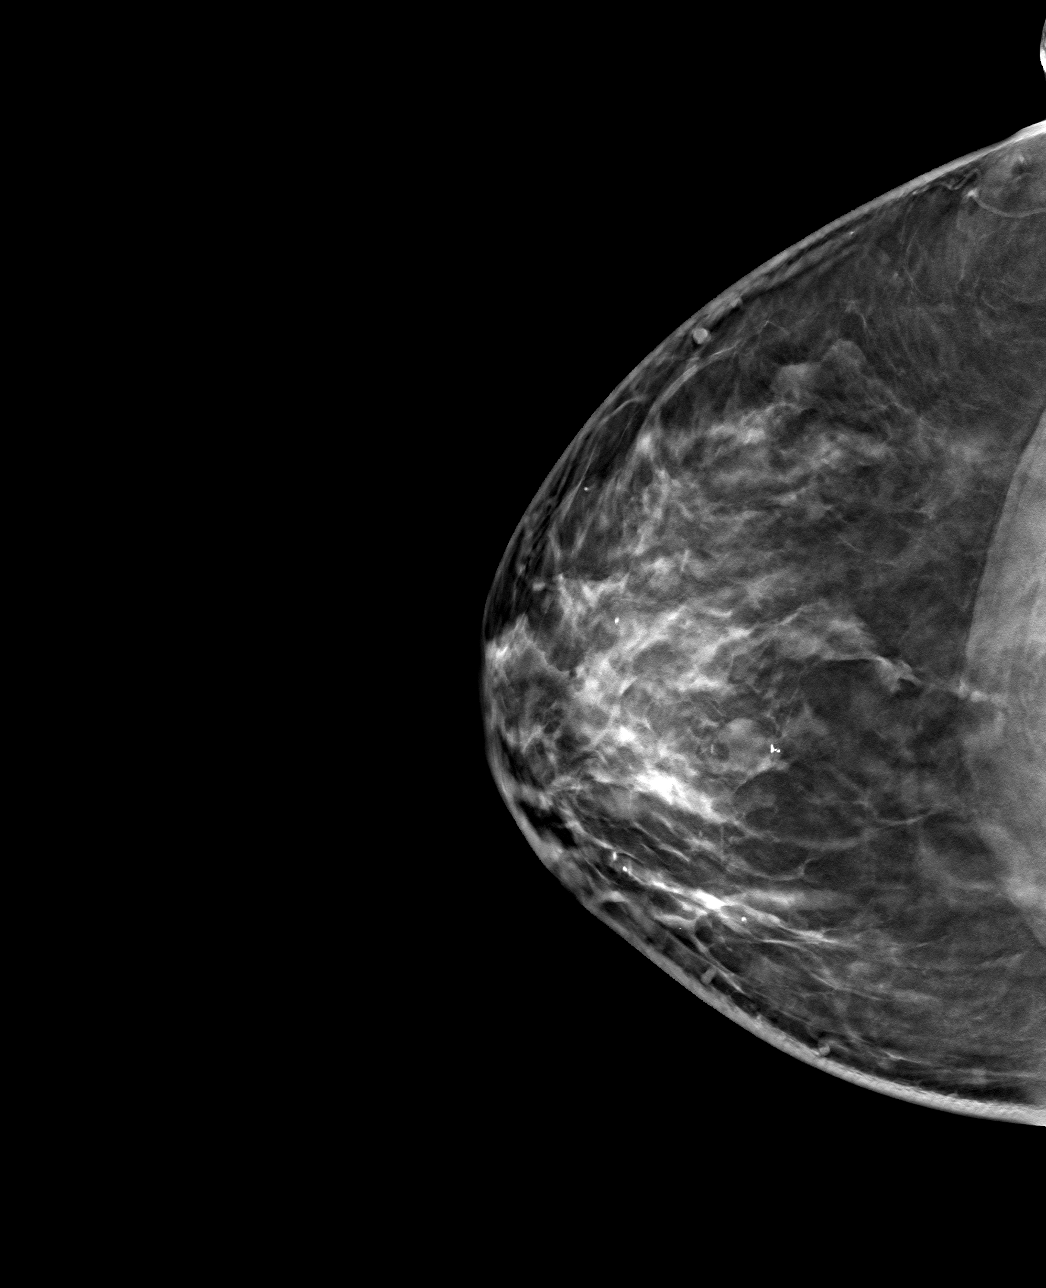

[4 of 12 positions shown; findings below may reference images not displayed]

ACR Breast Density Category c: The breast tissue is heterogeneously
dense, which may obscure small masses.
FINDINGS: There are no findings suspicious for malignancy.
IMPRESSION: No mammographic evidence of malignancy. A result letter of this
screening mammogram will be mailed directly to the patient.

RECOMMENDATION:
Screening mammogram in one year. (Code:7R-V-O57)

BI-RADS CATEGORY  1: Negative.

## 2023-02-28 DIAGNOSIS — E1165 Type 2 diabetes mellitus with hyperglycemia: Secondary | ICD-10-CM | POA: Diagnosis not present

## 2023-02-28 DIAGNOSIS — E1065 Type 1 diabetes mellitus with hyperglycemia: Secondary | ICD-10-CM | POA: Diagnosis not present

## 2023-02-28 DIAGNOSIS — E119 Type 2 diabetes mellitus without complications: Secondary | ICD-10-CM | POA: Diagnosis not present

## 2023-02-28 DIAGNOSIS — I10 Essential (primary) hypertension: Secondary | ICD-10-CM | POA: Diagnosis not present

## 2023-02-28 DIAGNOSIS — Z7985 Long-term (current) use of injectable non-insulin antidiabetic drugs: Secondary | ICD-10-CM | POA: Diagnosis not present

## 2023-02-28 DIAGNOSIS — E040 Nontoxic diffuse goiter: Secondary | ICD-10-CM | POA: Diagnosis not present

## 2023-02-28 DIAGNOSIS — Z7984 Long term (current) use of oral hypoglycemic drugs: Secondary | ICD-10-CM | POA: Diagnosis not present

## 2023-02-28 DIAGNOSIS — E039 Hypothyroidism, unspecified: Secondary | ICD-10-CM | POA: Diagnosis not present

## 2023-03-10 ENCOUNTER — Ambulatory Visit
Admission: RE | Admit: 2023-03-10 | Discharge: 2023-03-10 | Disposition: A | Discharge status: Home / Self Care | Payer: Medicare Other | Source: Ambulatory Visit | Attending: Obstetrics and Gynecology | Admitting: Obstetrics and Gynecology

## 2023-03-10 DIAGNOSIS — Z1231 Encounter for screening mammogram for malignant neoplasm of breast: Secondary | ICD-10-CM

## 2023-03-26 ENCOUNTER — Encounter (HOSPITAL_BASED_OUTPATIENT_CLINIC_OR_DEPARTMENT_OTHER): Payer: Self-pay | Admitting: Pediatrics

## 2023-03-26 ENCOUNTER — Emergency Department (HOSPITAL_BASED_OUTPATIENT_CLINIC_OR_DEPARTMENT_OTHER)
Admission: EM | Admit: 2023-03-26 | Discharge: 2023-03-26 | Disposition: A | Discharge status: Home / Self Care | Payer: Medicare Other | Attending: Emergency Medicine | Admitting: Emergency Medicine

## 2023-03-26 ENCOUNTER — Emergency Department (HOSPITAL_BASED_OUTPATIENT_CLINIC_OR_DEPARTMENT_OTHER): Payer: Medicare Other

## 2023-03-26 ENCOUNTER — Other Ambulatory Visit: Payer: Self-pay

## 2023-03-26 DIAGNOSIS — Z853 Personal history of malignant neoplasm of breast: Secondary | ICD-10-CM | POA: Insufficient documentation

## 2023-03-26 DIAGNOSIS — Z20822 Contact with and (suspected) exposure to covid-19: Secondary | ICD-10-CM | POA: Insufficient documentation

## 2023-03-26 DIAGNOSIS — J45909 Unspecified asthma, uncomplicated: Secondary | ICD-10-CM | POA: Insufficient documentation

## 2023-03-26 DIAGNOSIS — J069 Acute upper respiratory infection, unspecified: Secondary | ICD-10-CM | POA: Diagnosis not present

## 2023-03-26 DIAGNOSIS — E039 Hypothyroidism, unspecified: Secondary | ICD-10-CM | POA: Diagnosis not present

## 2023-03-26 DIAGNOSIS — E1122 Type 2 diabetes mellitus with diabetic chronic kidney disease: Secondary | ICD-10-CM | POA: Insufficient documentation

## 2023-03-26 DIAGNOSIS — R059 Cough, unspecified: Secondary | ICD-10-CM | POA: Diagnosis not present

## 2023-03-26 DIAGNOSIS — N189 Chronic kidney disease, unspecified: Secondary | ICD-10-CM | POA: Insufficient documentation

## 2023-03-26 DIAGNOSIS — R0989 Other specified symptoms and signs involving the circulatory and respiratory systems: Secondary | ICD-10-CM | POA: Diagnosis not present

## 2023-03-26 DIAGNOSIS — R918 Other nonspecific abnormal finding of lung field: Secondary | ICD-10-CM | POA: Diagnosis not present

## 2023-03-26 DIAGNOSIS — I129 Hypertensive chronic kidney disease with stage 1 through stage 4 chronic kidney disease, or unspecified chronic kidney disease: Secondary | ICD-10-CM | POA: Insufficient documentation

## 2023-03-26 DIAGNOSIS — B9789 Other viral agents as the cause of diseases classified elsewhere: Secondary | ICD-10-CM | POA: Insufficient documentation

## 2023-03-26 DIAGNOSIS — Z79899 Other long term (current) drug therapy: Secondary | ICD-10-CM | POA: Diagnosis not present

## 2023-03-26 LAB — CBC WITH DIFFERENTIAL/PLATELET
Abs Immature Granulocytes: 0.02 10*3/uL (ref 0.00–0.07)
Basophils Absolute: 0.1 10*3/uL (ref 0.0–0.1)
Basophils Relative: 1 %
Eosinophils Absolute: 0.1 10*3/uL (ref 0.0–0.5)
Eosinophils Relative: 2 %
HCT: 38.6 % (ref 36.0–46.0)
Hemoglobin: 13.1 g/dL (ref 12.0–15.0)
Immature Granulocytes: 0 %
Lymphocytes Relative: 24 %
Lymphs Abs: 1.5 10*3/uL (ref 0.7–4.0)
MCH: 30.3 pg (ref 26.0–34.0)
MCHC: 33.9 g/dL (ref 30.0–36.0)
MCV: 89.4 fL (ref 80.0–100.0)
Monocytes Absolute: 0.6 10*3/uL (ref 0.1–1.0)
Monocytes Relative: 9 %
Neutro Abs: 4.1 10*3/uL (ref 1.7–7.7)
Neutrophils Relative %: 64 %
Platelets: 189 10*3/uL (ref 150–400)
RBC: 4.32 MIL/uL (ref 3.87–5.11)
RDW: 11.6 % (ref 11.5–15.5)
WBC: 6.4 10*3/uL (ref 4.0–10.5)
nRBC: 0 % (ref 0.0–0.2)

## 2023-03-26 LAB — BASIC METABOLIC PANEL
Anion gap: 10 (ref 5–15)
BUN: 10 mg/dL (ref 8–23)
CO2: 25 mmol/L (ref 22–32)
Calcium: 8.8 mg/dL — ABNORMAL LOW (ref 8.9–10.3)
Chloride: 101 mmol/L (ref 98–111)
Creatinine, Ser: 0.86 mg/dL (ref 0.44–1.00)
GFR, Estimated: >60 mL/min (ref >60)
Glucose, Bld: 134 mg/dL — ABNORMAL HIGH (ref 70–99)
Potassium: 3.8 mmol/L (ref 3.5–5.1)
Sodium: 136 mmol/L (ref 135–145)

## 2023-03-26 LAB — RESP PANEL BY RT-PCR (RSV, FLU A&B, COVID)  RVPGX2
Influenza A by PCR: NEGATIVE
Influenza B by PCR: NEGATIVE
Resp Syncytial Virus by PCR: NEGATIVE
SARS Coronavirus 2 by RT PCR: NEGATIVE

## 2023-03-26 MED ORDER — LIDOCAINE VISCOUS HCL 2 % MT SOLN
15.0000 mL | Freq: Once | OROMUCOSAL | Status: AC
  Administered 2023-03-26: 15 mL via OROMUCOSAL
  Filled 2023-03-26: qty 15

## 2023-03-26 MED ORDER — LIDOCAINE VISCOUS HCL 2 % MT SOLN
15.0000 mL | OROMUCOSAL | 0 refills | Status: AC | PRN
Start: 2023-03-26 — End: ?

## 2023-03-26 NOTE — ED Provider Notes (Signed)
[facility] Provider Note  CSN: [id number] Arrival date & time: 03/26/23 1006  Chief Complaint(s) Cough  HPI Hannah Nicholson is a 72 y.o. female with history of diabetes, hypertension, hyperlipidemia presenting to the emergency department with cough.  Patient reports cough, congestion, sore throat since Saturday.  She was around her niece who was sick with the flu.  Having some intermittent fevers and chills.  No nausea or vomiting, abdominal pain, urinary symptoms, difficulty breathing.  No chest pain.    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   Sleep disturbance    Stress reaction    Unspecified asthma(493.90)    Vaginal atrophy    Vitamin D deficiency    Patient Active Problem List   Diagnosis Date Noted   Acute stress disorder 03/28/2021   Atrophy of vagina 03/28/2021   Breast cancer in situ 03/28/2021   Daytime somnolence 03/28/2021   Dyspareunia not due to a substance or known physiological condition 03/28/2021   Essential hypertension 03/28/2021   Fatigue 03/28/2021   Gout 03/28/2021   Hypertensive retinopathy 03/28/2021   Hypothyroidism 03/28/2021   Low back pain 03/28/2021   Nontoxic single thyroid nodule 03/28/2021   Obesity 03/28/2021   Obstructive sleep apnea syndrome 03/28/2021   History of colonic polyps 03/28/2021   Personal  history of malignant neoplasm of breast 03/28/2021   Pure hypercholesterolemia 03/28/2021   Sleep disturbance 03/28/2021   Type 2 diabetes mellitus with other specified complication (HCC) 03/28/2021   Vitamin B12 deficiency 03/28/2021   Vitamin D deficiency 03/28/2021   Snoring 10/05/2020   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   Witnessed episode of apnea 10/05/2020   Excessive sleepiness 10/05/2020   Left ureteral calculus 09/20/2019   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 Authorizing Provider  lidocaine (XYLOCAINE) 2 % solution Use as directed 15 mLs in the mouth or throat as needed for mouth pain. 03/26/23  Yes Lonell Grandchild, MD  Ascorbic Acid (VITAMIN C) 500 MG CAPS 1 capsule    [provider]  Calcium Citrate-Vitamin D 315-5 MG-MCG TABS Take 1 tablet by mouth 2 (two) times daily.    [provider]  carvedilol (COREG) 3.125 MG tablet Take 3.125 mg by mouth 2 (two) times daily with a meal.    [provider]  co-enzyme Q-10 30 MG capsule Take 100 mg by mouth daily.    [provider]  Cyanocobalamin (VITAMIN B12) 1000 MCG TBCR 1 tablet    [provider]  fexofenadine (ALLEGRA) 180 MG tablet 1 tablet Swallow whole with water; do not take with fruit juices.    [provider]  glimepiride (AMARYL) 2 MG tablet Take 2 mg by mouth daily. 03/14/20   [provider]  hydrochlorothiazide (HYDRODIURIL) 25 MG tablet Take 1 tablet by mouth  daily as needed (fluid).  02/28/15   [provider]  metFORMIN (GLUCOPHAGE-XR) 750 MG 24 hr tablet 1 tablet 02/08/21   [provider]  metoprolol succinate (TOPROL-XL) 25 MG 24 hr tablet Take 1 tablet by mouth daily. 03/06/15   [provider]  Omega-3 1000 MG CAPS Take by mouth.    [provider]  [facility] ULTRA test strip 1 each daily. 11/30/19   [provider]  Red Yeast Rice 600 MG CAPS Take 600 capsules by mouth daily.    [provider]  sertraline (ZOLOFT) 50 MG tablet Take 1 tablet by mouth daily.    [provider]  SYNTHROID 50 MCG tablet Take 50 mcg by mouth daily. 08/02/19   [provider]  tamsulosin (FLOMAX) 0.4 MG CAPS capsule Take 1 capsule (0.4 mg total) by mouth at bedtime. 08/02/21   Noel Christmas, MD  telmisartan (MICARDIS) 80 MG tablet Take 1 tablet by mouth daily. Not taking 04/15/18   [provider]  vitamin B-12 (CYANOCOBALAMIN) 1000 MCG tablet Take 1,000 mcg by mouth daily.    [provider]  Vitamin D, Ergocalciferol, (DRISDOL) 1.25 MG (50000 UNIT) CAPS capsule Take 50,000 Units by mouth once a week. 02/22/20   [provider]  Zinc 50 MG TABS 1 tablet    [provider]                                                                                                                                    Past Surgical History Past Surgical History:  Procedure Laterality Date   BREAST LUMPECTOMY Left 2013   BREAST CANCER AND RADIATION, NO CHEMO   BREAST SURGERY Left 2017   chemo done in 2017, mastectomy   CHOLECYSTECTOMY     COLONOSCOPY     TUBULAR ADENOMA   CYSTOSCOPY W/ URETERAL STENT PLACEMENT Left 09/20/2019   Procedure: CYSTOSCOPY WITH RETROGRADE PYELOGRAM/URETERAL STENT PLACEMENT;  Surgeon: Noel Christmas, MD;  Location: [facility];  Service: Urology;  Laterality: Left;   CYSTOSCOPY/URETEROSCOPY/HOLMIUM LASER/STENT PLACEMENT Left 10/19/2019   Procedure:  CYSTOSCOPY LEFT URETEROSCOPY/HOLMIUM LASER/STENT EXHANGE;  Surgeon: Noel Christmas, MD;  Location: [facility];  Service: Urology;  Laterality: Left;   EXTRACORPOREAL SHOCK WAVE LITHOTRIPSY Right 08/02/2021   Procedure: EXTRACORPOREAL SHOCK WAVE LITHOTRIPSY (ESWL);  Surgeon: Noel Christmas, MD;  Location: [facility];  Service:  Urology;  Laterality: Right;   HEMORRHOID SURGERY     LAPAROSCOPIC INGUINAL HERNIA WITH UMBILICAL HERNIA     MASTECTOMY Left 2016   sinus surgery     TONSILLECTOMY     TOTAL ABDOMINAL HYSTERECTOMY     partial   TUBAL LIGATION     Family History Family History  Problem Relation Age of Onset   Asthma Father    Heart disease Mother    Prostate cancer Brother 96   Prostate cancer Maternal Uncle        dx 18s   Lupus Sister    Breast cancer Sister 26   Stroke Sister 35   Prostate cancer Maternal Uncle        dx 51s   Lung cancer Cousin        thought to be due to sarcoidosis    Social History Social History   Tobacco Use   Smoking status: Never   Smokeless tobacco: Never  Vaping Use   Vaping status: Never Used  Substance Use Topics   Alcohol use: No   Drug use: No   Allergies Iodinated contrast media, Iodine, Amlodipine besylate, Hydrochlorothiazide, Lisinopril, and Melatonin  Review of Systems Review of Systems  All other systems reviewed and are negative.   Physical Exam Vital Signs  I have reviewed the triage vital signs BP (!) 161/84 (BP Location: Right Arm)   Pulse 86   Temp 99.6 F (37.6 C) (Oral)   Resp 16   Ht 5\' 3"  (1.6 m)   Wt 78 kg   SpO2 98%   BMI 30.47 kg/m  Physical Exam Vitals and nursing note reviewed.  Constitutional:      General: She is not in acute distress.    Appearance: She is well-developed.  HENT:     Head: Normocephalic and atraumatic.     Mouth/Throat:     Mouth: Mucous membranes are moist.     Comments: Uvula midline, no tonsillar enlargement, normal voice, uvula  midline. Eyes:     Pupils: Pupils are equal, round, and reactive to light.  Cardiovascular:     Rate and Rhythm: Normal rate and regular rhythm.     Heart sounds: No murmur heard. Pulmonary:     Effort: Pulmonary effort is normal. No respiratory distress.     Breath sounds: Normal breath sounds.  Abdominal:     General: Abdomen is flat.     Palpations: Abdomen is soft.     Tenderness: There is no abdominal tenderness.  Musculoskeletal:        General: No tenderness.     Right lower leg: No edema.     Left lower leg: No edema.  Skin:    General: Skin is warm and dry.  Neurological:     General: No focal deficit present.     Mental Status: She is alert. Mental status is at baseline.  Psychiatric:        Mood and Affect: Mood normal.        Behavior: Behavior normal.     ED Results and Treatments Labs (all labs ordered are listed, but only abnormal results are displayed) Labs Reviewed  BASIC METABOLIC PANEL - Abnormal; Notable for the following components:      Result Value   Glucose, Bld 134 (*)    Calcium 8.8 (*)    All other components within normal limits  RESP PANEL BY RT-PCR (RSV, FLU A&B, COVID)  RVPGX2  CBC WITH DIFFERENTIAL/PLATELET  Radiology DG Chest Port 1 View  Result Date: 03/26/2023 CLINICAL DATA:  Cough and congestion. EXAM: PORTABLE CHEST 1 VIEW COMPARISON:  06/19/2021. FINDINGS: There is slight linear opacity left lung base likely scar or atelectasis. No consolidation, pneumothorax or effusion. No edema. Normal cardiopericardial silhouette. IMPRESSION: Slight left basilar scar or atelectasis. Electronically Signed   By: Karen Kays M.D.   On: 03/26/2023 11:56    Pertinent labs & imaging results that were available during my care of the patient were reviewed by me and considered in my medical decision making (see MDM for  details).  Medications Ordered in ED Medications  lidocaine (XYLOCAINE) 2 % viscous mouth solution 15 mL (15 mLs Mouth/Throat Given 03/26/23 1250)                                                                                                                                     Procedures Procedures  (including critical care time)  Medical Decision Making / ED Course   MDM:  72 year old female presenting with URI symptoms.  Patient overall very well-appearing, physical exam unremarkable.  Vitals are reassuring.  Lungs are clear on exam.  Suspect viral URI.  Her COVID/flu/RSV test is negative.  Labs are reassuring with no leukocytosis.  Low concern for pneumonia, and her chest x-ray is clear.  Very low concern for underlying bacterial illness. Will discharge patient to home. All questions answered. Patient comfortable with plan of discharge. Return precautions discussed with patient and specified on the after visit summary.       Additional history obtained: -Additional history obtained from spouse    Lab Tests: -I ordered, reviewed, and interpreted labs.   The pertinent results include:   Labs Reviewed  BASIC METABOLIC PANEL - Abnormal; Notable for the following components:      Result Value   Glucose, Bld 134 (*)    Calcium 8.8 (*)    All other components within normal limits  RESP PANEL BY RT-PCR (RSV, FLU A&B, COVID)  RVPGX2  CBC WITH DIFFERENTIAL/PLATELET    Notable for no leukocytosis, no AKI    Imaging Studies ordered: I ordered imaging studies including CXR On my interpretation imaging demonstrates clear lungs  I independently visualized and interpreted imaging. I agree with the radiologist interpretation   Medicines ordered and prescription drug management: Meds ordered this encounter  Medications   lidocaine (XYLOCAINE) 2 % solution    Sig: Use as directed 15 mLs in the mouth or throat as needed for mouth pain.    Dispense:  100 mL    Refill:  0    lidocaine (XYLOCAINE) 2 % viscous mouth solution 15 mL    -I have reviewed the patients home medicines and have made adjustments as needed    Social Determinants of Health:  Diagnosis or treatment significantly limited by social determinants of health: obesity   Reevaluation: After the interventions noted above, I reevaluated the  patient and found that their symptoms have improved  Co morbidities that complicate the patient evaluation  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    Final Clinical Impression(s) / ED Diagnoses Final diagnoses:  Viral URI with cough     This chart was dictated using voice recognition software.  Despite best efforts to proofread,  errors can occur which can change the documentation meaning.    Lonell Grandchild, MD 03/26/23 [phone]

## 2023-03-26 NOTE — Discharge Instructions (Signed)
Please take Tylenol and Motrin for your symptoms at home.  You can take 1000 mg of Tylenol every 6 hours and 600 mg of ibuprofen every 6 hours as needed for your symptoms.  You can take these medicines together as needed, either at the same time, or alternating every 3 hours.  Please also drink hot tea with honey and lemon.  This can help with your throat pain.  Please be sure to keep up with your fluid intake.  If you develop any new or worsening symptoms such as shortness of breath, chest pain, high fevers, vomiting, worsening cough, or any other concerning symptoms, please return to the emergency department for reassessment.

## 2023-03-26 NOTE — ED Triage Notes (Signed)
C/O chest congestion, cough, post nasal drip and throat pain since Saturday.

## 2023-03-30 ENCOUNTER — Emergency Department (HOSPITAL_BASED_OUTPATIENT_CLINIC_OR_DEPARTMENT_OTHER)
Admission: EM | Admit: 2023-03-30 | Discharge: 2023-03-30 | Disposition: A | Discharge status: Home / Self Care | Payer: Medicare Other | Attending: Emergency Medicine | Admitting: Emergency Medicine

## 2023-03-30 ENCOUNTER — Encounter (HOSPITAL_BASED_OUTPATIENT_CLINIC_OR_DEPARTMENT_OTHER): Payer: Self-pay | Admitting: Emergency Medicine

## 2023-03-30 ENCOUNTER — Emergency Department (HOSPITAL_BASED_OUTPATIENT_CLINIC_OR_DEPARTMENT_OTHER): Payer: Medicare Other

## 2023-03-30 DIAGNOSIS — M5459 Other low back pain: Secondary | ICD-10-CM | POA: Diagnosis not present

## 2023-03-30 DIAGNOSIS — K5730 Diverticulosis of large intestine without perforation or abscess without bleeding: Secondary | ICD-10-CM | POA: Diagnosis not present

## 2023-03-30 DIAGNOSIS — Z9049 Acquired absence of other specified parts of digestive tract: Secondary | ICD-10-CM | POA: Diagnosis not present

## 2023-03-30 DIAGNOSIS — M545 Low back pain, unspecified: Secondary | ICD-10-CM | POA: Insufficient documentation

## 2023-03-30 DIAGNOSIS — R109 Unspecified abdominal pain: Secondary | ICD-10-CM | POA: Diagnosis present

## 2023-03-30 DIAGNOSIS — N200 Calculus of kidney: Secondary | ICD-10-CM | POA: Diagnosis not present

## 2023-03-30 LAB — URINALYSIS, ROUTINE W REFLEX MICROSCOPIC
Bilirubin Urine: NEGATIVE
Glucose, UA: NEGATIVE mg/dL
Ketones, ur: NEGATIVE mg/dL
Leukocytes,Ua: NEGATIVE
Nitrite: NEGATIVE
Protein, ur: NEGATIVE mg/dL
Specific Gravity, Urine: 1.015 (ref 1.005–1.030)
pH: 6 (ref 5.0–8.0)

## 2023-03-30 LAB — URINALYSIS, MICROSCOPIC (REFLEX)

## 2023-03-30 MED ORDER — IBUPROFEN 400 MG PO TABS
600.0000 mg | ORAL_TABLET | Freq: Once | ORAL | Status: DC
  Filled 2023-03-30: qty 1

## 2023-03-30 MED ORDER — KETOROLAC TROMETHAMINE 30 MG/ML IJ SOLN
15.0000 mg | Freq: Once | INTRAMUSCULAR | Status: AC
  Administered 2023-03-30: 15 mg via INTRAMUSCULAR
  Filled 2023-03-30: qty 1

## 2023-03-30 NOTE — ED Triage Notes (Signed)
Lower left back pain X 1-2 days worse this morning.

## 2023-03-30 NOTE — ED Provider Notes (Signed)
[facility]  Provider Note  CSN: [id number] Arrival date & time: 03/30/23 0046  History Chief Complaint  Patient presents with   Back Pain   Flank Pain    Hannah Nicholson is a 72 y.o. female with history of multiple prior renal stones reports 2 days of L flank pain, worse with movement. She was recently sick with an URI and has not been drinking fluids as much as usual. Denies any dysuria or hematuria. No fevers, vomiting.    Home Medications Prior to Admission medications   Medication Sig Start Date End Date Taking? Authorizing Provider  Ascorbic Acid (VITAMIN C) 500 MG CAPS 1 capsule    [provider]  Calcium Citrate-Vitamin D 315-5 MG-MCG TABS Take 1 tablet by mouth 2 (two) times daily.    [provider]  carvedilol (COREG) 3.125 MG tablet Take 3.125 mg by mouth 2 (two) times daily with a meal.    [provider]  co-enzyme Q-10 30 MG capsule Take 100 mg by mouth daily.    [provider]  Cyanocobalamin (VITAMIN B12) 1000 MCG TBCR 1 tablet    [provider]  fexofenadine (ALLEGRA) 180 MG tablet 1 tablet Swallow whole with water; do not take with fruit juices.    [provider]  glimepiride (AMARYL) 2 MG tablet Take 2 mg by mouth daily. 03/14/20   [provider]  hydrochlorothiazide (HYDRODIURIL) 25 MG tablet Take 1 tablet by mouth daily as needed (fluid).  02/28/15   [provider]  lidocaine (XYLOCAINE) 2 % solution Use as directed 15 mLs in the mouth or throat as needed for mouth pain. 03/26/23   Lonell Grandchild, MD  metFORMIN (GLUCOPHAGE-XR) 750 MG 24 hr tablet 1 tablet 02/08/21   [provider]  metoprolol succinate (TOPROL-XL) 25 MG 24 hr tablet Take 1 tablet by mouth daily. 03/06/15   [provider]  Omega-3 1000 MG CAPS Take by mouth.    [provider]  [facility] ULTRA test strip 1 each daily. 11/30/19   [provider]  Red Yeast Rice 600 MG CAPS Take 600 capsules by mouth daily.    [provider]  sertraline (ZOLOFT) 50 MG tablet Take 1 tablet by mouth daily.    [provider]  SYNTHROID 50 MCG tablet Take 50 mcg by mouth daily. 08/02/19   [provider]  tamsulosin (FLOMAX) 0.4 MG CAPS capsule Take 1 capsule (0.4 mg total) by mouth at bedtime. 08/02/21   Noel Christmas, MD  telmisartan (MICARDIS) 80 MG tablet Take 1 tablet by mouth daily. Not taking 04/15/18   [provider]  vitamin B-12 (CYANOCOBALAMIN) 1000 MCG tablet Take 1,000 mcg by mouth daily.    [provider]  Vitamin D, Ergocalciferol, (DRISDOL) 1.25 MG (50000 UNIT) CAPS capsule Take 50,000 Units by mouth once a week. 02/22/20   [provider]  Zinc 50 MG TABS 1 tablet    [provider]     Allergies    Iodinated contrast media, Iodine, Amlodipine besylate, Hydrochlorothiazide, Lisinopril, and Melatonin   Review of Systems   Review of Systems Please see HPI for pertinent positives and negatives  Physical Exam BP (!) 164/91 (BP Location: Right Arm)   Pulse 82   Temp 98.6 F (37 C)   Resp 16   Ht 5\' 3"  (1.6 m)   Wt 78 kg   SpO2 97%   BMI 30.46 kg/m  Physical Exam Vitals and nursing note reviewed.  Constitutional:      Appearance: Normal appearance.  HENT:     Head: Normocephalic and atraumatic.     Nose: Nose normal.     Mouth/Throat:     Mouth: Mucous membranes are moist.  Eyes:     Extraocular Movements: Extraocular movements intact.     Conjunctiva/sclera: Conjunctivae normal.  Cardiovascular:     Rate and Rhythm: Normal rate.  Pulmonary:     Effort: Pulmonary effort is normal.     Breath sounds: Normal breath sounds.  Abdominal:     General: Abdomen is flat.     Palpations: Abdomen is soft.     Tenderness: There is no abdominal tenderness.  Musculoskeletal:        General: Tenderness (L flank) present. No swelling. Normal range of  motion.     Cervical back: Neck supple.  Skin:    General: Skin is warm and dry.  Neurological:     General: No focal deficit present.     Mental Status: She is alert.  Psychiatric:        Mood and Affect: Mood normal.     ED Results / Procedures / Treatments   EKG None  Procedures Procedures  Medications Ordered in the ED Medications  ketorolac (TORADOL) 30 MG/ML injection 15 mg (15 mg Intramuscular Given 03/30/23 0340)    Initial Impression and Plan  Patient here with L lower back/flank pain. History of complicated renal stones. This seems more MSK but given her history will send for CT. UA is clear. She declines pain medications at this time.  ED Course   Clinical Course as of 03/30/23 0403  Wynelle Link Mar 30, 2023  0401 I personally viewed the images from radiology studies and agree with radiologist interpretation: CT shows intrarenal stones, but no ureteral stones or hydronephrosis to explain her pain. She has been coughing a lot since recent URI and likely has a muscle strain. She reports she has Aleve and muscle relaxer at home she can take. She will follow up with Urology for the renal stones. PCP follow up, RTED for any other concerns.   [CS]    Clinical Course User Index [CS] Pollyann Savoy, MD     MDM Rules/Calculators/A&P Medical Decision Making Problems Addressed: Acute left-sided low back pain without sciatica: acute illness or injury Renal stones: chronic illness or injury  Amount and/or Complexity of Data Reviewed Labs: ordered. Decision-making details documented in ED Course. Radiology: ordered and independent interpretation performed. Decision-making details documented in ED Course.  Risk Prescription drug management.     Final Clinical Impression(s) / ED Diagnoses Final diagnoses:  Acute left-sided low back pain without sciatica  Renal stones    Rx / DC Orders ED Discharge Orders     None        Pollyann Savoy, MD 03/30/23  0403

## 2023-04-01 DIAGNOSIS — M5432 Sciatica, left side: Secondary | ICD-10-CM | POA: Diagnosis not present

## 2023-04-30 ENCOUNTER — Inpatient Hospital Stay
Admission: RE | Admit: 2023-04-30 | Discharge: 2023-04-30 | Discharge status: Home / Self Care | Payer: Medicare Other | Source: Ambulatory Visit | Attending: Family Medicine | Admitting: Family Medicine

## 2023-04-30 DIAGNOSIS — E2839 Other primary ovarian failure: Secondary | ICD-10-CM

## 2023-04-30 DIAGNOSIS — Z853 Personal history of malignant neoplasm of breast: Secondary | ICD-10-CM | POA: Diagnosis not present

## 2023-05-29 DIAGNOSIS — Z23 Encounter for immunization: Secondary | ICD-10-CM | POA: Diagnosis not present

## 2023-05-29 DIAGNOSIS — M6740 Ganglion, unspecified site: Secondary | ICD-10-CM | POA: Diagnosis not present

## 2023-06-19 DIAGNOSIS — M79641 Pain in right hand: Secondary | ICD-10-CM | POA: Diagnosis not present

## 2023-06-19 DIAGNOSIS — M79642 Pain in left hand: Secondary | ICD-10-CM | POA: Diagnosis not present

## 2023-06-19 DIAGNOSIS — M67442 Ganglion, left hand: Secondary | ICD-10-CM | POA: Diagnosis not present

## 2023-06-25 IMAGING — DX DG ABDOMEN 1V
1 series · 1 of 1 positions shown · non-contrast
Comparison: July 17, 2021

CLINICAL DATA: Right-sided nephrolithiasis with pre lithotripsy
evaluation.

EXAM:
ABDOMEN - 1 VIEW

[abdomen kub]
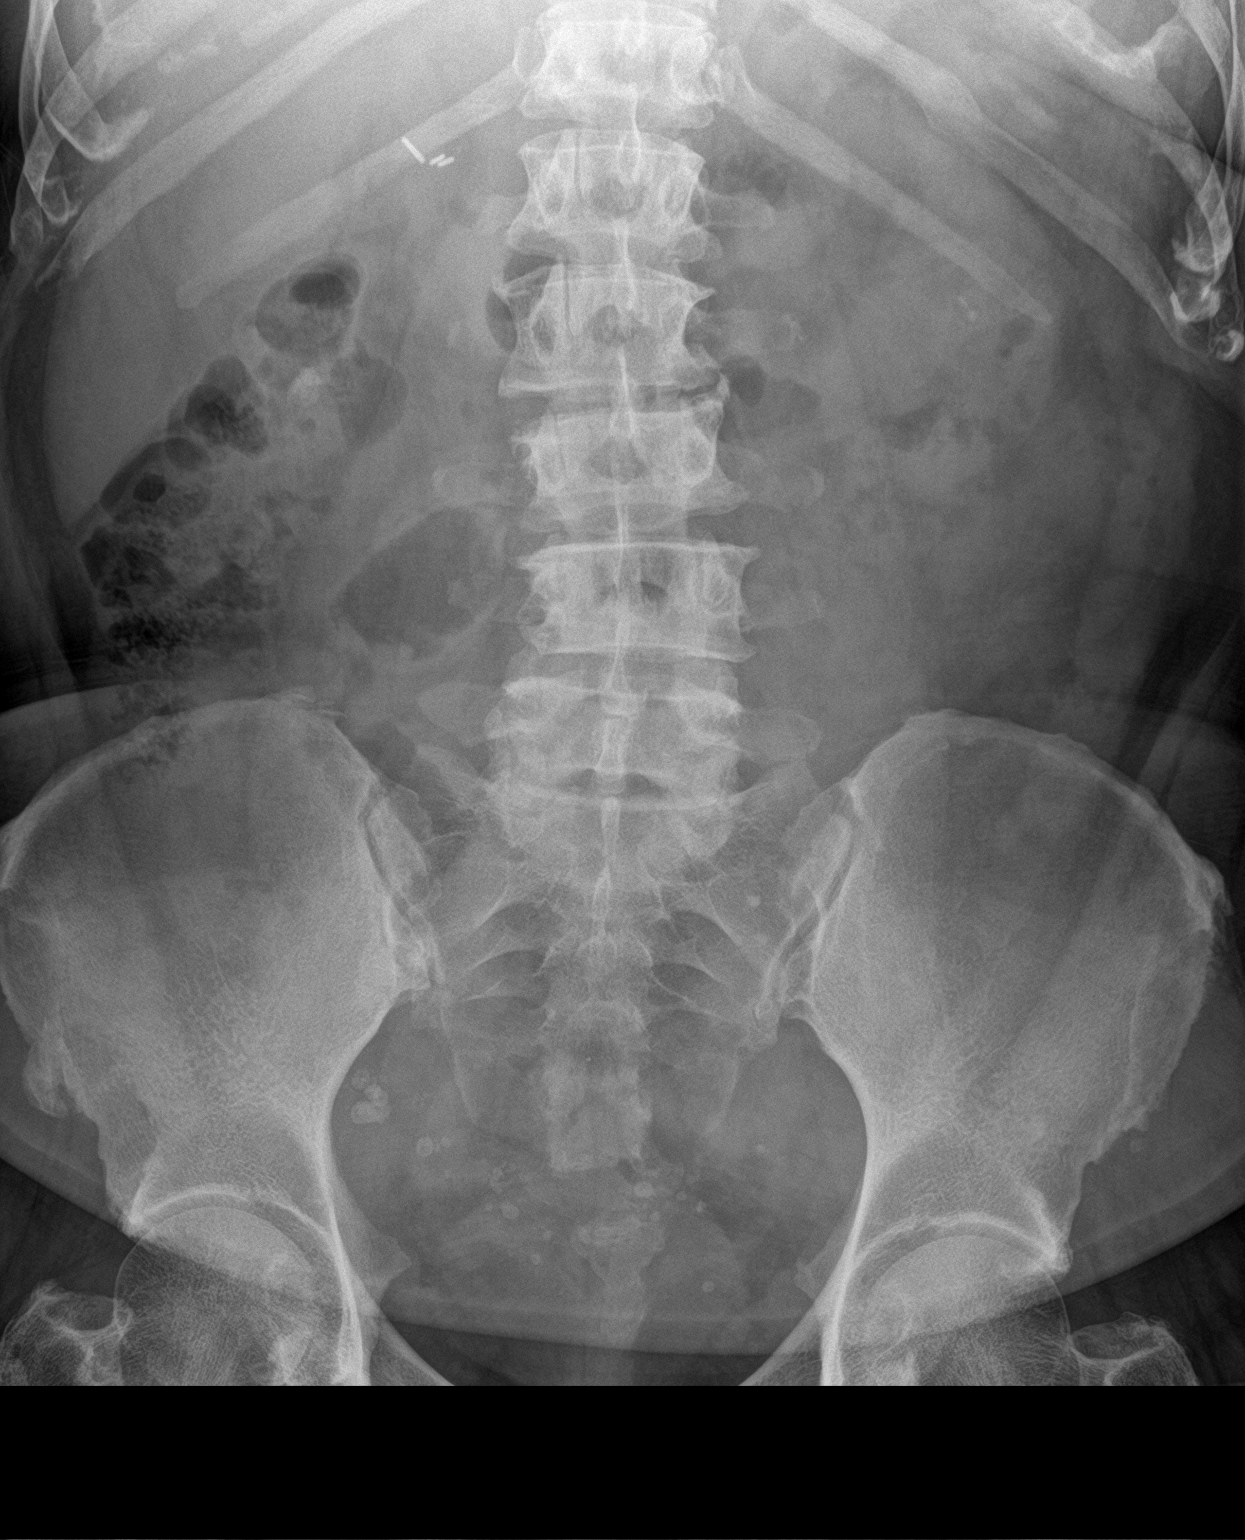

[1 of 1 positions shown; findings below may reference images not displayed]

FINDINGS: The bowel gas pattern is normal. Radiopaque surgical clips are seen
within the right upper quadrant. A stable 1.0 cm cluster of soft
tissue calcifications are seen projecting over the mid right kidney.
Adjacent 3 mm soft tissue calcifications are also seen projecting
over the mid left kidney. Numerous small phleboliths are noted
within the pelvis.
IMPRESSION: Bilateral nonobstructing renal calculi.

## 2023-07-22 DIAGNOSIS — M654 Radial styloid tenosynovitis [de Quervain]: Secondary | ICD-10-CM | POA: Diagnosis not present

## 2023-07-22 DIAGNOSIS — M79602 Pain in left arm: Secondary | ICD-10-CM | POA: Diagnosis not present

## 2023-07-22 DIAGNOSIS — E1165 Type 2 diabetes mellitus with hyperglycemia: Secondary | ICD-10-CM | POA: Diagnosis not present

## 2023-07-23 DIAGNOSIS — E042 Nontoxic multinodular goiter: Secondary | ICD-10-CM | POA: Diagnosis not present

## 2023-07-23 DIAGNOSIS — E039 Hypothyroidism, unspecified: Secondary | ICD-10-CM | POA: Diagnosis not present

## 2023-08-09 DIAGNOSIS — J0140 Acute pansinusitis, unspecified: Secondary | ICD-10-CM | POA: Diagnosis not present

## 2023-08-21 DIAGNOSIS — E11319 Type 2 diabetes mellitus with unspecified diabetic retinopathy without macular edema: Secondary | ICD-10-CM | POA: Diagnosis not present

## 2023-08-21 DIAGNOSIS — H40013 Open angle with borderline findings, low risk, bilateral: Secondary | ICD-10-CM | POA: Diagnosis not present

## 2023-08-21 DIAGNOSIS — H40053 Ocular hypertension, bilateral: Secondary | ICD-10-CM | POA: Diagnosis not present

## 2023-08-21 DIAGNOSIS — H04123 Dry eye syndrome of bilateral lacrimal glands: Secondary | ICD-10-CM | POA: Diagnosis not present

## 2023-10-02 DIAGNOSIS — J4520 Mild intermittent asthma, uncomplicated: Secondary | ICD-10-CM | POA: Diagnosis not present

## 2023-10-02 DIAGNOSIS — E538 Deficiency of other specified B group vitamins: Secondary | ICD-10-CM | POA: Diagnosis not present

## 2023-10-02 DIAGNOSIS — E559 Vitamin D deficiency, unspecified: Secondary | ICD-10-CM | POA: Diagnosis not present

## 2023-10-02 DIAGNOSIS — M109 Gout, unspecified: Secondary | ICD-10-CM | POA: Diagnosis not present

## 2023-10-02 DIAGNOSIS — Z0000 Encounter for general adult medical examination without abnormal findings: Secondary | ICD-10-CM | POA: Diagnosis not present

## 2023-10-02 DIAGNOSIS — I10 Essential (primary) hypertension: Secondary | ICD-10-CM | POA: Diagnosis not present

## 2023-10-02 DIAGNOSIS — R5383 Other fatigue: Secondary | ICD-10-CM | POA: Diagnosis not present

## 2023-10-02 DIAGNOSIS — E7800 Pure hypercholesterolemia, unspecified: Secondary | ICD-10-CM | POA: Diagnosis not present

## 2023-10-24 DIAGNOSIS — M545 Low back pain, unspecified: Secondary | ICD-10-CM | POA: Diagnosis not present

## 2023-10-24 DIAGNOSIS — N200 Calculus of kidney: Secondary | ICD-10-CM | POA: Diagnosis not present

## 2023-10-29 DIAGNOSIS — M19011 Primary osteoarthritis, right shoulder: Secondary | ICD-10-CM | POA: Diagnosis not present

## 2023-10-29 DIAGNOSIS — E1165 Type 2 diabetes mellitus with hyperglycemia: Secondary | ICD-10-CM | POA: Diagnosis not present

## 2023-10-29 DIAGNOSIS — R053 Chronic cough: Secondary | ICD-10-CM | POA: Diagnosis not present

## 2023-10-29 DIAGNOSIS — M25511 Pain in right shoulder: Secondary | ICD-10-CM | POA: Diagnosis not present

## 2023-10-29 DIAGNOSIS — J209 Acute bronchitis, unspecified: Secondary | ICD-10-CM | POA: Diagnosis not present

## 2023-10-29 DIAGNOSIS — J40 Bronchitis, not specified as acute or chronic: Secondary | ICD-10-CM | POA: Diagnosis not present

## 2023-10-29 DIAGNOSIS — Z853 Personal history of malignant neoplasm of breast: Secondary | ICD-10-CM | POA: Diagnosis not present

## 2023-10-29 DIAGNOSIS — J45909 Unspecified asthma, uncomplicated: Secondary | ICD-10-CM | POA: Diagnosis not present

## 2023-10-29 DIAGNOSIS — E785 Hyperlipidemia, unspecified: Secondary | ICD-10-CM | POA: Diagnosis not present

## 2023-10-29 DIAGNOSIS — E1169 Type 2 diabetes mellitus with other specified complication: Secondary | ICD-10-CM | POA: Diagnosis not present

## 2023-10-29 DIAGNOSIS — E039 Hypothyroidism, unspecified: Secondary | ICD-10-CM | POA: Diagnosis not present

## 2023-10-29 DIAGNOSIS — E538 Deficiency of other specified B group vitamins: Secondary | ICD-10-CM | POA: Diagnosis not present

## 2023-10-29 DIAGNOSIS — J4520 Mild intermittent asthma, uncomplicated: Secondary | ICD-10-CM | POA: Diagnosis not present

## 2023-10-29 DIAGNOSIS — M25512 Pain in left shoulder: Secondary | ICD-10-CM | POA: Diagnosis not present

## 2023-10-29 DIAGNOSIS — E559 Vitamin D deficiency, unspecified: Secondary | ICD-10-CM | POA: Diagnosis not present

## 2023-10-29 DIAGNOSIS — M19012 Primary osteoarthritis, left shoulder: Secondary | ICD-10-CM | POA: Diagnosis not present

## 2023-11-18 DIAGNOSIS — E1165 Type 2 diabetes mellitus with hyperglycemia: Secondary | ICD-10-CM | POA: Diagnosis not present

## 2024-01-28 ENCOUNTER — Other Ambulatory Visit: Payer: Self-pay | Admitting: Obstetrics and Gynecology

## 2024-01-28 DIAGNOSIS — Z1231 Encounter for screening mammogram for malignant neoplasm of breast: Secondary | ICD-10-CM

## 2024-02-02 DIAGNOSIS — M67442 Ganglion, left hand: Secondary | ICD-10-CM | POA: Diagnosis not present

## 2024-02-10 DIAGNOSIS — R195 Other fecal abnormalities: Secondary | ICD-10-CM | POA: Diagnosis not present

## 2024-02-10 DIAGNOSIS — K219 Gastro-esophageal reflux disease without esophagitis: Secondary | ICD-10-CM | POA: Diagnosis not present

## 2024-02-10 DIAGNOSIS — R109 Unspecified abdominal pain: Secondary | ICD-10-CM | POA: Diagnosis not present

## 2024-02-10 DIAGNOSIS — K5900 Constipation, unspecified: Secondary | ICD-10-CM | POA: Diagnosis not present

## 2024-02-10 DIAGNOSIS — R110 Nausea: Secondary | ICD-10-CM | POA: Diagnosis not present

## 2024-03-10 ENCOUNTER — Ambulatory Visit
Admission: RE | Admit: 2024-03-10 | Discharge: 2024-03-10 | Disposition: A | Source: Ambulatory Visit | Attending: Obstetrics and Gynecology | Admitting: Obstetrics and Gynecology

## 2024-03-10 DIAGNOSIS — Z1231 Encounter for screening mammogram for malignant neoplasm of breast: Secondary | ICD-10-CM

## 2024-03-16 DIAGNOSIS — E039 Hypothyroidism, unspecified: Secondary | ICD-10-CM | POA: Diagnosis not present

## 2024-03-16 DIAGNOSIS — E1169 Type 2 diabetes mellitus with other specified complication: Secondary | ICD-10-CM | POA: Diagnosis not present

## 2024-03-16 DIAGNOSIS — E785 Hyperlipidemia, unspecified: Secondary | ICD-10-CM | POA: Diagnosis not present

## 2024-03-16 DIAGNOSIS — E1165 Type 2 diabetes mellitus with hyperglycemia: Secondary | ICD-10-CM | POA: Diagnosis not present

## 2024-03-16 DIAGNOSIS — E559 Vitamin D deficiency, unspecified: Secondary | ICD-10-CM | POA: Diagnosis not present

## 2024-03-16 DIAGNOSIS — I152 Hypertension secondary to endocrine disorders: Secondary | ICD-10-CM | POA: Diagnosis not present

## 2024-03-16 DIAGNOSIS — Z0000 Encounter for general adult medical examination without abnormal findings: Secondary | ICD-10-CM | POA: Diagnosis not present

## 2024-03-16 DIAGNOSIS — K1370 Unspecified lesions of oral mucosa: Secondary | ICD-10-CM | POA: Diagnosis not present

## 2024-03-16 DIAGNOSIS — E1159 Type 2 diabetes mellitus with other circulatory complications: Secondary | ICD-10-CM | POA: Diagnosis not present

## 2024-03-18 DIAGNOSIS — E1169 Type 2 diabetes mellitus with other specified complication: Secondary | ICD-10-CM | POA: Diagnosis not present

## 2024-03-18 DIAGNOSIS — E785 Hyperlipidemia, unspecified: Secondary | ICD-10-CM | POA: Diagnosis not present

## 2024-03-18 DIAGNOSIS — Z0000 Encounter for general adult medical examination without abnormal findings: Secondary | ICD-10-CM | POA: Diagnosis not present

## 2024-03-18 DIAGNOSIS — E039 Hypothyroidism, unspecified: Secondary | ICD-10-CM | POA: Diagnosis not present

## 2024-03-18 DIAGNOSIS — E1165 Type 2 diabetes mellitus with hyperglycemia: Secondary | ICD-10-CM | POA: Diagnosis not present

## 2024-03-18 DIAGNOSIS — E559 Vitamin D deficiency, unspecified: Secondary | ICD-10-CM | POA: Diagnosis not present

## 2024-03-21 ENCOUNTER — Other Ambulatory Visit: Payer: Self-pay

## 2024-03-21 ENCOUNTER — Emergency Department (HOSPITAL_BASED_OUTPATIENT_CLINIC_OR_DEPARTMENT_OTHER)

## 2024-03-21 ENCOUNTER — Encounter (HOSPITAL_BASED_OUTPATIENT_CLINIC_OR_DEPARTMENT_OTHER): Payer: Self-pay | Admitting: Emergency Medicine

## 2024-03-21 ENCOUNTER — Emergency Department (HOSPITAL_BASED_OUTPATIENT_CLINIC_OR_DEPARTMENT_OTHER): Admission: EM | Admit: 2024-03-21 | Discharge: 2024-03-21 | Disposition: A | Discharge status: Home / Self Care

## 2024-03-21 DIAGNOSIS — Z853 Personal history of malignant neoplasm of breast: Secondary | ICD-10-CM | POA: Diagnosis not present

## 2024-03-21 DIAGNOSIS — Z7984 Long term (current) use of oral hypoglycemic drugs: Secondary | ICD-10-CM | POA: Insufficient documentation

## 2024-03-21 DIAGNOSIS — E1122 Type 2 diabetes mellitus with diabetic chronic kidney disease: Secondary | ICD-10-CM | POA: Diagnosis not present

## 2024-03-21 DIAGNOSIS — I129 Hypertensive chronic kidney disease with stage 1 through stage 4 chronic kidney disease, or unspecified chronic kidney disease: Secondary | ICD-10-CM | POA: Diagnosis not present

## 2024-03-21 DIAGNOSIS — R519 Headache, unspecified: Secondary | ICD-10-CM | POA: Insufficient documentation

## 2024-03-21 DIAGNOSIS — R29818 Other symptoms and signs involving the nervous system: Secondary | ICD-10-CM | POA: Diagnosis not present

## 2024-03-21 DIAGNOSIS — R110 Nausea: Secondary | ICD-10-CM | POA: Insufficient documentation

## 2024-03-21 DIAGNOSIS — N189 Chronic kidney disease, unspecified: Secondary | ICD-10-CM | POA: Insufficient documentation

## 2024-03-21 DIAGNOSIS — R42 Dizziness and giddiness: Secondary | ICD-10-CM | POA: Insufficient documentation

## 2024-03-21 LAB — CBC WITH DIFFERENTIAL/PLATELET
Abs Immature Granulocytes: 0.01 K/uL (ref 0.00–0.07)
Basophils Absolute: 0 K/uL (ref 0.0–0.1)
Basophils Relative: 1 %
Eosinophils Absolute: 0.1 K/uL (ref 0.0–0.5)
Eosinophils Relative: 1 %
HCT: 40 % (ref 36.0–46.0)
Hemoglobin: 14 g/dL (ref 12.0–15.0)
Immature Granulocytes: 0 %
Lymphocytes Relative: 31 %
Lymphs Abs: 1.2 K/uL (ref 0.7–4.0)
MCH: 31.1 pg (ref 26.0–34.0)
MCHC: 35 g/dL (ref 30.0–36.0)
MCV: 88.9 fL (ref 80.0–100.0)
Monocytes Absolute: 0.3 K/uL (ref 0.1–1.0)
Monocytes Relative: 9 %
Neutro Abs: 2.2 K/uL (ref 1.7–7.7)
Neutrophils Relative %: 58 %
Platelets: 220 K/uL (ref 150–400)
RBC: 4.5 MIL/uL (ref 3.87–5.11)
RDW: 11.8 % (ref 11.5–15.5)
WBC: 3.9 K/uL — ABNORMAL LOW (ref 4.0–10.5)
nRBC: 0 % (ref 0.0–0.2)

## 2024-03-21 LAB — TROPONIN T, HIGH SENSITIVITY
Troponin T High Sensitivity: <15 ng/L (ref 0–19)
Troponin T High Sensitivity: <15 ng/L (ref 0–19)

## 2024-03-21 LAB — BASIC METABOLIC PANEL WITH GFR
Anion gap: 10 (ref 5–15)
BUN: 8 mg/dL (ref 8–23)
CO2: 26 mmol/L (ref 22–32)
Calcium: 9.6 mg/dL (ref 8.9–10.3)
Chloride: 103 mmol/L (ref 98–111)
Creatinine, Ser: 0.81 mg/dL (ref 0.44–1.00)
GFR, Estimated: >60 mL/min (ref >60)
Glucose, Bld: 174 mg/dL — ABNORMAL HIGH (ref 70–99)
Potassium: 4 mmol/L (ref 3.5–5.1)
Sodium: 140 mmol/L (ref 135–145)

## 2024-03-21 LAB — MAGNESIUM: Magnesium: 2 mg/dL (ref 1.7–2.4)

## 2024-03-21 MED ORDER — PROCHLORPERAZINE EDISYLATE 10 MG/2ML IJ SOLN
10.0000 mg | Freq: Once | INTRAMUSCULAR | Status: AC
  Administered 2024-03-21: 10 mg via INTRAVENOUS
  Filled 2024-03-21: qty 2

## 2024-03-21 MED ORDER — MECLIZINE HCL 25 MG PO TABS: 25.0000 mg | ORAL_TABLET | Freq: Three times a day (TID) | ORAL | 0 refills | Status: AC | PRN

## 2024-03-21 MED ORDER — LACTATED RINGERS IV BOLUS
1000.0000 mL | Freq: Once | INTRAVENOUS | Status: AC
  Administered 2024-03-21: 1000 mL via INTRAVENOUS

## 2024-03-21 MED ORDER — ONDANSETRON 4 MG PO TBDP: 4.0000 mg | ORAL_TABLET | Freq: Three times a day (TID) | ORAL | 0 refills | Status: AC | PRN

## 2024-03-21 MED ORDER — MECLIZINE HCL 25 MG PO TABS
25.0000 mg | ORAL_TABLET | Freq: Once | ORAL | Status: AC
  Administered 2024-03-21: 25 mg via ORAL
  Filled 2024-03-21: qty 1

## 2024-03-21 MED ORDER — KETOROLAC TROMETHAMINE 15 MG/ML IJ SOLN
15.0000 mg | Freq: Once | INTRAMUSCULAR | Status: AC
  Administered 2024-03-21: 15 mg via INTRAVENOUS
  Filled 2024-03-21: qty 1

## 2024-03-21 NOTE — ED Provider Notes (Signed)
 [place] [facility] AT [facility] Provider Note   CSN: [id number] Arrival date & time: 03/21/24  1433     Patient presents with: Dizziness   Hannah Nicholson is a 73 y.o. female.   This is a 73 year old female presenting emergency department for vertigo.  Reports normal state of health yesterday, had a mild headache last night.  When she awoke at 6 AM noted worsening posterior headache and also having vertigo described as a room spinning sensation.  Seemingly positional.  Last up to a few minutes with position changes and then resolves.  Associated with nausea.  No tinnitus.  No other neurodeficits.  Denies recent URI symptoms.  No fevers or chills.  Head some chest discomfort last week, no shortness of breath, no abdominal pain.   Dizziness      Prior to Admission medications   Medication Sig Start Date End Date Taking? Authorizing Provider  meclizine (ANTIVERT) 25 MG tablet Take 1 tablet (25 mg total) by mouth 3 (three) times daily as needed for dizziness. 03/21/24  Yes Sibley Rolison, Caron PARAS, DO  ondansetron  (ZOFRAN -ODT) 4 MG disintegrating tablet Take 1 tablet (4 mg total) by mouth every 8 (eight) hours as needed for nausea or vomiting. 03/21/24  Yes Neysa Caron PARAS, DO  Ascorbic Acid (VITAMIN C) 500 MG CAPS 1 capsule    [provider]  Calcium Citrate-Vitamin D 315-5 MG-MCG TABS Take 1 tablet by mouth 2 (two) times daily.    [provider]  carvedilol (COREG) 3.125 MG tablet Take 3.125 mg by mouth 2 (two) times daily with a meal.    [provider]  co-enzyme Q-10 30 MG capsule Take 100 mg by mouth daily.    [provider]  Cyanocobalamin  (VITAMIN B12) 1000 MCG TBCR 1 tablet    [provider]  fexofenadine (ALLEGRA) 180 MG tablet 1 tablet Swallow whole with water; do not take with fruit juices.    [provider]  glimepiride (AMARYL) 2 MG tablet Take 2 mg by mouth daily. 03/14/20   [provider]  hydrochlorothiazide (HYDRODIURIL) 25 MG tablet Take 1 tablet by mouth daily as needed (fluid).  02/28/15   [provider]  lidocaine  (XYLOCAINE ) 2 % solution Use as directed 15 mLs in the mouth or throat as needed for mouth pain. 03/26/23   Francesca Elsie CROME, MD  metFORMIN  (GLUCOPHAGE -XR) 750 MG 24 hr tablet 1 tablet 02/08/21   [provider]  metoprolol  succinate (TOPROL -XL) 25 MG 24 hr tablet Take 1 tablet by mouth daily. 03/06/15   [provider]  Omega-3 1000 MG CAPS Take by mouth.    [provider]  [facility] ULTRA test strip 1 each daily. 11/30/19   [provider]  Red Yeast Rice 600 MG CAPS Take 600 capsules by mouth daily.    [provider]  sertraline  (ZOLOFT ) 50 MG tablet Take 1 tablet by mouth daily.    [provider]  SYNTHROID  50 MCG tablet Take 50 mcg by mouth daily. 08/02/19   [provider]  tamsulosin  (FLOMAX ) 0.4 MG CAPS capsule Take 1 capsule (0.4 mg total) by mouth at bedtime. 08/02/21   Pace, Maryellen D, MD  telmisartan (MICARDIS) 80 MG tablet Take 1 tablet by mouth daily. Not taking 04/15/18   [provider]  vitamin B-12 (CYANOCOBALAMIN ) 1000 MCG tablet Take 1,000 mcg by mouth daily.    [provider]  Vitamin D, Ergocalciferol, (DRISDOL) 1.25 MG (50000 UNIT) CAPS  capsule Take 50,000 Units by mouth once a week. 02/22/20   [provider]  Zinc 50 MG TABS 1 tablet    [provider]    Allergies: Iodinated contrast media, Iodine, Amlodipine besylate, Hydrochlorothiazide, Lisinopril, and Melatonin    Review of Systems  Neurological:  Positive for dizziness.    Updated Vital Signs BP (!) 177/87   Pulse 68   Temp 97.6 F (36.4 C) (Oral)   Resp 15   Ht 5' 3 (1.6 m)   Wt 78.5 kg   SpO2 100%   BMI 30.65 kg/m   Physical Exam Vitals and nursing note reviewed.  Constitutional:      General: She is not in acute distress.    Appearance: She  is not toxic-appearing.  HENT:     Head: Normocephalic and atraumatic.     Left Ear: Tympanic membrane normal.     Nose: Nose normal.     Mouth/Throat:     Mouth: Mucous membranes are moist.  Eyes:     Extraocular Movements: Extraocular movements intact.     Conjunctiva/sclera: Conjunctivae normal.     Pupils: Pupils are equal, round, and reactive to light.  Cardiovascular:     Rate and Rhythm: Normal rate and regular rhythm.  Pulmonary:     Effort: Pulmonary effort is normal.     Breath sounds: Normal breath sounds.  Abdominal:     General: Abdomen is flat. There is no distension.     Tenderness: There is no abdominal tenderness.  Musculoskeletal:        General: Normal range of motion.     Cervical back: Normal range of motion. No rigidity.  Skin:    General: Skin is warm and dry.     Capillary Refill: Capillary refill takes less than 2 seconds.  Neurological:     General: No focal deficit present.     Mental Status: She is alert.     Cranial Nerves: No cranial nerve deficit.     Sensory: No sensory deficit.     Motor: No weakness.     Coordination: Coordination normal.  Psychiatric:        Mood and Affect: Mood normal.        Behavior: Behavior normal.     (all labs ordered are listed, but only abnormal results are displayed) Labs Reviewed  CBC WITH DIFFERENTIAL/PLATELET - Abnormal; Notable for the following components:      Result Value   WBC 3.9 (*)    All other components within normal limits  BASIC METABOLIC PANEL WITH GFR - Abnormal; Notable for the following components:   Glucose, Bld 174 (*)    All other components within normal limits  MAGNESIUM  TROPONIN T, HIGH SENSITIVITY  TROPONIN T, HIGH SENSITIVITY    EKG: EKG Interpretation Date/Time:  Sunday March 21 2024 14:50:16 EDT Ventricular Rate:  80 PR Interval:  160 QRS Duration:  85 QT Interval:  394 QTC Calculation: 455 R Axis:   32  Text Interpretation: Sinus rhythm Probable left atrial  enlargement Confirmed by Neysa Clap [phone]) on 03/21/2024 6:23:57 PM  Radiology: MR BRAIN WO CONTRAST Result Date: 03/21/2024 EXAM: MRI BRAIN WITHOUT CONTRAST 03/21/2024 04:30:00 PM TECHNIQUE: Multiplanar multisequence MRI of the head/brain was performed without the administration of intravenous contrast. COMPARISON: None available. CLINICAL HISTORY: Neuro deficit, acute, stroke suspected. Headache and vertigo since 6am. FINDINGS: BRAIN AND VENTRICLES: Mild atrophy and white matter changes are within normal limits for age. 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 No other midline abnormalities are present. No acute infarct. No intracranial hemorrhage. No other mass. No midline shift. No hydrocephalus. The sella is unremarkable. Normal flow voids. ORBITS: The remote right orbital blowout fracture is noted. SINUSES AND MASTOIDS: No acute abnormality. BONES AND SOFT TISSUES: Normal marrow signal. No acute soft tissue abnormality. IMPRESSION: 1. No acute intracranial abnormality to account for the reported neurologic symptoms. 2. Remote right orbital blowout fracture. 3. Midline lipoma over the corpus callosum. Electronically signed by: Lonni Necessary MD 03/21/2024 05:05 PM EDT RP Workstation: [id number]   DG Chest Portable 1 View Result Date: 03/21/2024 CLINICAL DATA:  Chest pain, dizziness. EXAM: PORTABLE CHEST 1 VIEW COMPARISON:  03/26/2023. FINDINGS: Trachea is midline. Heart size normal. Lungs are clear. No pleural fluid. IMPRESSION: No acute findings. Electronically Signed   By: Newell Eke M.D.   On: 03/21/2024 16:02     Procedures   Medications Ordered in the ED  meclizine (ANTIVERT) tablet 25 mg (25 mg Oral Given 03/21/24 1707)  lactated ringers  bolus 1,000 mL (0 mLs Intravenous Stopped 03/21/24 1924)  prochlorperazine (COMPAZINE) injection 10 mg (10 mg Intravenous Given 03/21/24 1718)  ketorolac  (TORADOL ) 15 MG/ML injection 15 mg (15 mg  Intravenous Given 03/21/24 1740)    Clinical Course as of 03/21/24 2352  Sun Mar 21, 2024  1713 MR BRAIN WO CONTRAST MPRESSION: 1. No acute intracranial abnormality to account for the reported neurologic symptoms. 2. Remote right orbital blowout fracture. 3. Midline lipoma over the corpus callosum.  Electronically signed by: Lonni Necessary MD 03/21/2024 05:05 PM EDT RP Workstation: [id number]   [TY]  2351 Patient is feeling better on on reevaluation.  Amatory to the bathroom.  Meclizine helped significantly.  Labs without significant abnormality.  No metabolic derangements.  Troponin negative, EKG without ischemic changes or arrhythmia.  Cardiac etiology unlikely.  Magnesium level normal.  MRI brain without acute stroke, chest x-ray without pulmonary edema.  Suspect peripheral vertigo.  Discharged with meclizine and Zofran .  Discussed follow-up with primary doctor.  Stable for discharge at this time. [TY]    Clinical Course User Index [TY] Neysa Caron PARAS, DO                                 Medical Decision Making This is a 73 year old female complex past medical history to include hypertension, CKD, sarcoidosis, gout, history of breast cancer, diabetes presenting emergency department with vertigo.  She is hypertensive 181/93, vital signs otherwise reassuring.  Physical exam patient with no symptoms at rest.  Dix-Hallpike did seemingly reproduce symptoms, but no noticeable nystagmus.  No localizing neurodeficits otherwise.  I suspect peripheral vertigo and will treat with meclizine, fluids, but also is having a unusual posterior headache which raises the suspicion for posterior stroke.  Will get MRI.  Will get basic screening labs and given her recent chest pain will also get cardiac screening labs.  Amount and/or Complexity of Data Reviewed Independent Historian:     Details: Family member notes that she was normal yesterday External Data Reviewed:     Details: Not on a blood  thinner, also does not appear to have any imaging of head Labs: ordered. Decision-making details documented in ED Course.    Details: See ED course Radiology: ordered and independent interpretation performed. Decision-making details documented in ED Course. ECG/medicine tests: ordered and independent interpretation performed.  Risk Prescription drug management.  Decision regarding hospitalization. Diagnosis or treatment significantly limited by social determinants of health. Risk Details: Poor health literacy      Final diagnoses:  Vertigo    ED Discharge Orders          Ordered    meclizine (ANTIVERT) 25 MG tablet  3 times daily PRN        03/21/24 1827    ondansetron  (ZOFRAN -ODT) 4 MG disintegrating tablet  Every 8 hours PRN        03/21/24 1827               Neysa Caron PARAS, DO 03/21/24 2352

## 2024-03-21 NOTE — ED Notes (Signed)
 Patient at MRI

## 2024-03-21 NOTE — Discharge Instructions (Signed)
 Take medications as needed for sympoms. Return for fevers, severe headache, persistant vertigo, vision loss, facial droop, chest pain, difficulty breathing, pass out or any new or worrisome symptoms.

## 2024-03-21 NOTE — ED Triage Notes (Signed)
 Pt with dizziness that began at 0600; also c/o posterior head pain; feels like room spinning if she moves too fast; +nausea

## 2024-03-22 DIAGNOSIS — K1370 Unspecified lesions of oral mucosa: Secondary | ICD-10-CM | POA: Diagnosis not present
